# Patient Record
Sex: Female | Born: 1995 | Race: Asian | Hispanic: No | Marital: Single | Smoking: Never smoker
Health system: Southern US, Community
[De-identification: ages and names within clinical notes are randomized; demographics above are authoritative.]

## PROBLEM LIST (undated history)

## (undated) DIAGNOSIS — E079 Disorder of thyroid, unspecified: Secondary | ICD-10-CM

## (undated) DIAGNOSIS — L659 Nonscarring hair loss, unspecified: Secondary | ICD-10-CM

## (undated) HISTORY — DX: Disorder of thyroid, unspecified: E07.9

## (undated) HISTORY — DX: Nonscarring hair loss, unspecified: L65.9

## (undated) HISTORY — PX: NO PAST SURGERIES: SHX2092

---
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
## 2016-02-02 ENCOUNTER — Telehealth (INDEPENDENT_AMBULATORY_CARE_PROVIDER_SITE_OTHER): Payer: Self-pay | Admitting: Pediatric Endocrinology

## 2016-02-02 NOTE — Telephone Encounter (Signed)
Along with the refill, please place lab orders for her January appointment.

## 2016-02-02 NOTE — Telephone Encounter (Signed)
Please send refill for [facility] to [facility] [address].

## 2016-02-03 ENCOUNTER — Other Ambulatory Visit (INDEPENDENT_AMBULATORY_CARE_PROVIDER_SITE_OTHER): Payer: Self-pay | Admitting: *Deleted

## 2016-02-03 DIAGNOSIS — E063 Autoimmune thyroiditis: Secondary | ICD-10-CM

## 2016-02-03 MED ORDER — LEVOTHYROXINE SODIUM 50 MCG PO TABS: 50.0000 ug | ORAL_TABLET | Freq: Every day | ORAL | 4 refills | Status: DC

## 2016-02-03 MED ORDER — LEVOTHYROXINE SODIUM 50 MCG PO TABS: 50.0000 ug | ORAL_TABLET | Freq: Every day | ORAL | 11 refills | Status: DC

## 2016-02-03 NOTE — Telephone Encounter (Signed)
Sent rx to pharmacy and added lab orders as requested.

## 2016-02-26 LAB — T4, FREE: FREE T4: 0.9 ng/dL (ref 0.8–1.4)

## 2016-02-26 LAB — TSH: TSH: 6.63 mIU/L — ABNORMAL HIGH (ref 0.50–4.30)

## 2016-02-29 ENCOUNTER — Ambulatory Visit (INDEPENDENT_AMBULATORY_CARE_PROVIDER_SITE_OTHER): Payer: Medicaid Other | Admitting: Family

## 2016-02-29 ENCOUNTER — Encounter (INDEPENDENT_AMBULATORY_CARE_PROVIDER_SITE_OTHER): Payer: Self-pay | Admitting: Family

## 2016-02-29 VITALS — BP 120/80 | HR 80 | Wt 122.4 lb

## 2016-02-29 DIAGNOSIS — E063 Autoimmune thyroiditis: Secondary | ICD-10-CM

## 2016-02-29 DIAGNOSIS — E038 Other specified hypothyroidism: Secondary | ICD-10-CM

## 2016-02-29 MED ORDER — LEVOTHYROXINE SODIUM 125 MCG PO TABS: 62.5000 ug | ORAL_TABLET | Freq: Every day | ORAL | 2 refills | Status: DC

## 2016-02-29 NOTE — Progress Notes (Signed)
Subjective:  Patient Name: Monique Sharp Date of Birth: [birth year]  MRN: [id number]  Monique Sharp  presents to the office today for follow-up evaluation and management of her hypothyroidism and hashimoto's thyroiditis  HISTORY OF PRESENT ILLNESS:   Monique Sharp is a 21 y.o. [place] female   Kimbria was accompanied by her mother  1. [name] was seen by her pcp in February 2014 for her [facility]. At that visit they discussed her complaints of weight loss, foot cramps, and hair loss. Her PCP obtained lab work which was remarkable for a TSH of 12.7. She did not have follow up labs and was not started on therapy. She was referred to endocrinology for further evaluation and management. Repeat labs drawn at her initial visit in 7/14 were remarkable for a TSH of 10.36 with a TPO ab of 82956 (nml <35) and a TGA of 4284 (nml <45).    2. The patient's last [facility] visit was on 03/17. In the interim, she has been generally healthy.   She is home from [place] on Christmas break. She reports that she is doing well. She is taking of Synthroid per day, she does not miss any doses. She denies fatigue, weight gain, constipation and cold intolerance. Having regular periods.    3. Pertinent Review of Systems:  Constitutional: The patient feels "good". The patient seems healthy and active. Eyes: Vision seems to be good. There are no recognized eye problems. Neck: The patient has no complaints of anterior neck swelling, soreness, tenderness, pressure, discomfort, or difficulty swallowing.   Heart: Heart rate increases with exercise or other physical activity. The patient has no complaints of palpitations, irregular heart beats, chest pain, or chest pressure.   Gastrointestinal: Bowel movents seem normal. The patient has no complaints of excessive hunger, acid reflux, upset stomach, stomach aches or pains, diarrhea, or constipation.  Legs: Muscle mass and strength seem normal. There are no complaints of numbness, tingling, burning,  or pain. No edema is noted.  Feet: There are no obvious foot problems. There are no complaints of numbness, tingling, burning, or pain. No edema is noted. Neurologic: There are no recognized problems with muscle movement and strength, sensation, or coordination.  PAST MEDICAL, FAMILY, AND SOCIAL HISTORY  Past Medical History:  Diagnosis Date  . Hair loss     Family History  Problem Relation Age of Onset  . Thyroid disease Neg Hx      Current Outpatient Prescriptions:  .  levothyroxine (SYNTHROID, LEVOTHROID) 125 MCG tablet, Take 0.5 tablets (62.5 mcg total) by mouth daily., Disp: 15 tablet, Rfl: 2  Allergies as of 02/29/2016  . (No Known Allergies)     reports that she has never smoked. She has never used smokeless tobacco. She reports that she does not drink alcohol or use drugs. Pediatric History  Patient Guardian Status  . Mother:  Sharp,Monique  . Father:  Sharp,Monique   Other Topics Concern  . Not on file   Social History Narrative   Lives with parents and sister  Graduated from [employer]. Biology major at [employer]. Freshman.   Primary Care Provider: Joneen Roach, NP  ROS: There are no other significant problems involving Monique Sharp's other body systems.   Objective:  Vital Signs:  BP 120/80   Pulse 80   Wt 122 lb 6.4 oz (55.5 kg)   BMI 21.71 kg/m  No height on file for this encounter.   Ht Readings from Last 3 Encounters:  05/04/15 5' 2.95" (1.599 m) (31 %,  Z= -0.51)*  02/12/15 5' 2.64" (1.591 m) (27 %, Z= -0.62)*  08/24/14 5' 2.6" (1.59 m) (27 %, Z= -0.63)*   * Growth percentiles are based on CDC 2-20 Years data.   Wt Readings from Last 3 Encounters:  02/29/16 122 lb 6.4 oz (55.5 kg) (41 %, Z= -0.22)*  05/04/15 125 lb 3.2 oz (56.8 kg) (51 %, Z= 0.03)*  02/12/15 122 lb (55.3 kg) (46 %, Z= -0.11)*   * Growth percentiles are based on CDC 2-20 Years data.   HC Readings from Last 3 Encounters:  No data found for [facility]   Body surface area is 1.57  meters squared. No height on file for this encounter. 41 %ile (Z= -0.22) based on CDC 2-20 Years weight-for-age data using vitals from 02/29/2016.    PHYSICAL EXAM:  Constitutional: The patient appears healthy and well nourished. The patient's height and weight are normal for age. She is alert and interactive.  Head: The head is normocephalic. Face: The face appears normal. There are no obvious dysmorphic features. Eyes: The eyes appear to be normally formed and spaced. Gaze is conjugate. There is no obvious arcus or proptosis. Moisture appears normal. Ears: The ears are normally placed and appear externally normal. Mouth: The oropharynx and tongue appear normal. Dentition appears to be normal for age. Oral moisture is normal. Neck: The neck appears to be visibly normal. The thyroid gland is normal in size. The consistency of the thyroid gland is normal. The thyroid gland is not tender to palpation. Lungs: The lungs are clear to auscultation. Air movement is good. Heart: Heart rate and rhythm are regular. Heart sounds S1 and S2 are normal. I did not appreciate any pathologic cardiac murmurs.   LAB DATA:  pending Results for orders placed or performed in visit on 02/03/16 (from the past 504 hour(s))  TSH   Collection Time: 02/25/16  2:52 PM  Result Value Ref Range   TSH 6.63 (H) 0.50 - 4.30 mIU/L  T4, free   Collection Time: 02/25/16  2:52 PM  Result Value Ref Range   Free T4 0.9 0.8 - 1.4 ng/dL     Assessment and Plan:   ASSESSMENT:  1. hashimotos thyroiditis- previously elevated antibodies  2. Hypothyroidism- clinically TSH is elevated on current dose of Synthroid.     PLAN:  1. Diagnostic: TFTs as above. TFT repeat in 6 weeks  2. Therapeutic: Increase Synthroid to 62.5 mcg per day   - Will half a 125 mcg tablet.   - Repeat labs in 6 weeks.  3. Patient education: discussed hypothyroid and treatment using Synthroid. Discussed need to increase based on labs.  No concerns  today. Questions answered and mother endorsed understanding.  4. Follow-up: 3 months     Gretchen ShortSpenser Skylinn Vialpando, FNP-C   Level of Service: This visit lasted in excess of 15 minutes. More than 50% of the visit was devoted to counseling.

## 2016-02-29 NOTE — Patient Instructions (Signed)
-   Get labs drawn in 6 weeks   - Find a [facility] near you  - Start 62. of Synthroid per day   - 1/2 tablet  - Follow up  In 3 month

## 2016-04-09 LAB — T4, FREE: FREE T4: 1.1 ng/dL (ref 0.8–1.4)

## 2016-04-09 LAB — TSH: TSH: 0.89 mIU/L (ref 0.50–4.30)

## 2016-04-11 ENCOUNTER — Telehealth (INDEPENDENT_AMBULATORY_CARE_PROVIDER_SITE_OTHER): Payer: Self-pay | Admitting: Family

## 2016-04-11 MED ORDER — LEVOTHYROXINE SODIUM 125 MCG PO TABS: 62.5000 ug | ORAL_TABLET | Freq: Every day | ORAL | 6 refills | Status: DC

## 2016-04-11 NOTE — Telephone Encounter (Signed)
Discussed that thyroid labs were normal. Continue 62. of synthroid per day. Refills sent.

## 2016-04-12 ENCOUNTER — Encounter (INDEPENDENT_AMBULATORY_CARE_PROVIDER_SITE_OTHER): Payer: Self-pay

## 2016-05-29 ENCOUNTER — Ambulatory Visit (INDEPENDENT_AMBULATORY_CARE_PROVIDER_SITE_OTHER): Payer: Medicaid Other | Admitting: Family

## 2016-06-05 ENCOUNTER — Ambulatory Visit (INDEPENDENT_AMBULATORY_CARE_PROVIDER_SITE_OTHER): Payer: Medicaid Other | Admitting: Family

## 2016-07-04 ENCOUNTER — Telehealth (INDEPENDENT_AMBULATORY_CARE_PROVIDER_SITE_OTHER): Payer: Self-pay | Admitting: Family

## 2016-07-04 NOTE — Telephone Encounter (Signed)
  Who's calling (name and relationship to patient) : [name], mother  Best contact number: [phone]  Provider they see: Gretchen ShortSpenser Beasley, NP  Reason for call: Mother called in stating that [name] is going to [facility] tomorrow to get the labs drawn for Friday's appointment.  She requests the lab orders to be sent to:  [facility] [address], [address] [place], [place] [phone]) [phone]       PRESCRIPTION REFILL ONLY  Name of prescription:  Pharmacy:

## 2016-07-05 ENCOUNTER — Telehealth (INDEPENDENT_AMBULATORY_CARE_PROVIDER_SITE_OTHER): Payer: Self-pay | Admitting: Family

## 2016-07-05 ENCOUNTER — Other Ambulatory Visit (INDEPENDENT_AMBULATORY_CARE_PROVIDER_SITE_OTHER): Payer: Self-pay | Admitting: *Deleted

## 2016-07-05 DIAGNOSIS — E034 Atrophy of thyroid (acquired): Secondary | ICD-10-CM

## 2016-07-05 NOTE — Telephone Encounter (Signed)
Labs placed in portal for [facility].

## 2016-07-05 NOTE — Telephone Encounter (Signed)
Made in error. Emily M Hull °

## 2016-07-06 LAB — T4, FREE: Free T4: 1.28 ng/dL (ref 0.93–1.60)

## 2016-07-06 LAB — T3, FREE: T3, Free: 2.9 pg/mL (ref 2.3–5.0)

## 2016-07-06 LAB — TSH: TSH: 6.17 u[IU]/mL — ABNORMAL HIGH (ref 0.450–4.500)

## 2016-07-07 ENCOUNTER — Ambulatory Visit (INDEPENDENT_AMBULATORY_CARE_PROVIDER_SITE_OTHER): Payer: Self-pay | Admitting: Family

## 2016-07-07 ENCOUNTER — Encounter (INDEPENDENT_AMBULATORY_CARE_PROVIDER_SITE_OTHER): Payer: Self-pay | Admitting: Family

## 2016-07-07 VITALS — BP 108/62 | HR 78 | Ht 62.95 in | Wt 122.4 lb

## 2016-07-07 DIAGNOSIS — E063 Autoimmune thyroiditis: Secondary | ICD-10-CM

## 2016-07-07 NOTE — Progress Notes (Signed)
Subjective:  Patient Name: Monique Sharp Date of Birth: 06/06/1995  MRN: [id number]  Monique Sharp  presents to the office today for follow-up evaluation and management of her hypothyroidism and hashimoto's thyroiditis  HISTORY OF PRESENT ILLNESS:   Monique Sharp is a 21 y.o. [place] female   Monique Sharp was accompanied by her mother  1. Monique Sharp was [facility] by her [facility] in [facility]. At that visit they discussed her complaints of weight loss, foot cramps, and hair loss. Her PCP obtained lab work which was remarkable for a TSH of 12.7. She did not have follow up labs and was not started on therapy. She was referred to endocrinology for further evaluation and management. Repeat labs drawn at her initial visit in 7/14 were remarkable for a TSH of 10.36 with a TPO ab of [id number] (nml <35) and a TGA of 4284 (nml <45).    2. The patient's last [facility] visit was on 03/17. In the interim, she has been generally healthy.   [place] is home [place] on summer break. She is planning to stay in [place] all summer and hopes to get a summer job.  She reports that she is doing well. She is taking 62.5mcg of Synthroid per day, she rarely misses any doses. She denies fatigue, weight gain, constipation and cold intolerance. Having regular periods.    3. Pertinent Review of Systems:  Constitutional: The patient feels "good". The patient seems healthy and active. Eyes: Vision seems to be good. There are no recognized eye problems. Neck: The patient has no complaints of anterior neck swelling, soreness, tenderness, pressure, discomfort, or difficulty swallowing.   Heart: Heart rate increases with exercise or other physical activity. The patient has no complaints of palpitations, irregular heart beats, chest pain, or chest pressure.   Gastrointestinal: Bowel movents seem normal. The patient has no complaints of excessive hunger, acid reflux, upset stomach, stomach aches or pains, diarrhea, or constipation.  Legs: Muscle mass  and strength seem normal. There are no complaints of numbness, tingling, burning, or pain. No edema is noted.  Feet: There are no obvious foot problems. There are no complaints of numbness, tingling, burning, or pain. No edema is noted. Neurologic: There are no recognized problems with muscle movement and strength, sensation, or coordination.  PAST MEDICAL, FAMILY, AND SOCIAL HISTORY  Past Medical History:  Diagnosis Date  . Hair loss     Family History  Problem Relation Age of Onset  . Thyroid disease Neg Hx      Current Outpatient Prescriptions:  .  levothyroxine (SYNTHROID, LEVOTHROID) 125 MCG tablet, Take 0.5 tablets (62.5 mcg total) by mouth daily., Disp: 15 tablet, Rfl: 6  Allergies as of 07/07/2016  . (No Known Allergies)     reports that she has never smoked. She has never used smokeless tobacco. She reports that she does not drink alcohol or use drugs. Pediatric History  Patient Guardian Status  . Mother:  Monique Sharp,Monique Sharp  . Father:  Monique Sharp,Monique Sharp   Other Topics Concern  . Not on file   Social History Narrative   Lives with parents and sister  Graduated from [employer]. Biology major at [employer]. Freshman.   Primary Care Provider: Joneen Roachhomas, Gillian W, NP  ROS: There are no other significant problems involving Suellen's other body systems.   Objective:  Vital Signs:  BP 108/62   Pulse 78   Ht 5' 2.95" (1.599 m)   Wt 122 lb 6.4 oz (55.5 kg)   BMI 21.71 kg/m  Blood pressure percentiles are 31.7 % systolic and 30.8 % diastolic based on the August 2017 AAP Clinical Practice Guideline.   Ht Readings from Last 3 Encounters:  07/07/16 5' 2.95" (1.599 m) (30 %, Z= -0.52)*  05/04/15 5' 2.95" (1.599 m) (31 %, Z= -0.51)*  02/12/15 5' 2.64" (1.591 m) (27 %, Z= -0.62)*   * Growth percentiles are based on CDC 2-20 Years data.   Wt Readings from Last 3 Encounters:  07/07/16 122 lb 6.4 oz (55.5 kg) (40 %, Z= -0.26)*  02/29/16 122 lb 6.4 oz (55.5 kg) (41 %, Z= -0.22)*   05/04/15 125 lb 3.2 oz (56.8 kg) (51 %, Z= 0.03)*   * Growth percentiles are based on CDC 2-20 Years data.   HC Readings from Last 3 Encounters:  No data found for [facility]   Body surface area is 1.57 meters squared. 30 %ile (Z= -0.52) based on CDC 2-20 Years stature-for-age data using vitals from 07/07/2016. 40 %ile (Z= -0.26) based on CDC 2-20 Years weight-for-age data using vitals from 07/07/2016.    PHYSICAL EXAM:  Constitutional: The patient appears healthy and well nourished. The patient's height and weight are normal for age. She is alert and interactive.  Head: The head is normocephalic. Face: The face appears normal. There are no obvious dysmorphic features. Eyes: The eyes appear to be normally formed and spaced. Gaze is conjugate. There is no obvious arcus or proptosis. Moisture appears normal. Ears: The ears are normally placed and appear externally normal. Mouth: The oropharynx and tongue appear normal. Dentition appears to be normal for age. Oral moisture is normal. Neck: The neck appears to be visibly normal. The thyroid gland is normal in size. The consistency of the thyroid gland is normal. The thyroid gland is not tender to palpation. Lungs: The lungs are clear to auscultation. Air movement is good. Heart: Heart rate and rhythm are regular. Heart sounds S1 and S2 are normal. I did not appreciate any pathologic cardiac murmurs.   LAB DATA:  pending Results for orders placed or performed in visit on 07/05/16 (from the past 504 hour(s))  TSH   Collection Time: 07/05/16  3:09 PM  Result Value Ref Range   TSH 6.170 (H) 0.450 - 4.500 uIU/mL  T4, free   Collection Time: 07/05/16  3:09 PM  Result Value Ref Range   Free T4 1.28 0.93 - 1.60 ng/dL  T3, free   Collection Time: 07/05/16  3:09 PM  Result Value Ref Range   T3, Free 2.9 2.3 - 5.0 pg/mL     Assessment and Plan:   ASSESSMENT:  1. hashimotos thyroiditis- previously elevated antibodies  2. Hypothyroidism-  clinically TSH is elevated on current dose of Synthroid. Her pattern has been she will have slight elevation one visit and the next will be low normal. She is clinically euthyroid.     PLAN:  1. Diagnostic: TFTs as above. Repeat in 3 months.  2. Therapeutic:Continue 62.5 mcg per day    - Repeat labs in 3 months. If TSH is elevated or she becomes symptomatic we will increase synthroid to per day.  3. Patient education: discussed hypothyroid and treatment using Synthroid. Discussed need to increase based on labs.  No concerns today. Questions answered and mother endorsed understanding.  4. Follow-up: 3 months     Gretchen Short, FNP-C   Level of Service: This visit lasted in excess of 15 minutes. More than 50% of the visit was devoted to counseling.

## 2016-07-07 NOTE — Patient Instructions (Signed)
Continue 62.5 mcg per day, ever day  Take first thing in the morning  Follow up in 3 months, labs prior to visit.

## 2016-10-09 ENCOUNTER — Ambulatory Visit (INDEPENDENT_AMBULATORY_CARE_PROVIDER_SITE_OTHER): Payer: Medicaid Other | Admitting: Family

## 2016-12-14 ENCOUNTER — Other Ambulatory Visit (INDEPENDENT_AMBULATORY_CARE_PROVIDER_SITE_OTHER): Payer: Self-pay | Admitting: Family

## 2016-12-15 ENCOUNTER — Telehealth (INDEPENDENT_AMBULATORY_CARE_PROVIDER_SITE_OTHER): Payer: Self-pay

## 2016-12-15 NOTE — Telephone Encounter (Signed)
Call to mom [name] advised refilled medication for 30 days only until she schedules follow up appt. Adv sent note to Gretchen ShortSpenser Beasley FNP about the refill. She did not keep her appt in Aug. And has not scheduled follow up. Mom reports she was studying abroad and when she got home had to go back to [place] for classes and will not be home until Christmas break. Advised will let provider know. She is going to see her this weekend and take her med to her and will call and make visit after finding out her schedule.

## 2017-01-20 ENCOUNTER — Other Ambulatory Visit (INDEPENDENT_AMBULATORY_CARE_PROVIDER_SITE_OTHER): Payer: Self-pay | Admitting: Family

## 2017-01-23 ENCOUNTER — Telehealth (INDEPENDENT_AMBULATORY_CARE_PROVIDER_SITE_OTHER): Payer: Self-pay | Admitting: Family

## 2017-01-23 NOTE — Telephone Encounter (Signed)
°  Who's calling (name and relationship to patient) : Mom Best contact number: [phone] Provider they see: Ovidio KinSpenser  Reason for call: Mom stated that pt needs a refill and the pharmacy needs to be changed. The pharmacy I listed below is the new pharmacy mom would like to use.     PRESCRIPTION REFILL ONLY  Name of prescription: Levothyroxine Pharmacy: [id number]  [address] [phone]

## 2017-01-23 NOTE — Telephone Encounter (Signed)
[phone]- confirmed patient has not picked up an rx for this month. Also confirmed all patient needs to do is call [facility] [place] and ask them to prepare the rx for her they are able to see the order per the pharmacist.  Call to Mom left message for Teea to call the pharm there and they will prepare rx. Reminded about Dec appt and if that is not kept she will not be able to receive refills.

## 2017-02-12 ENCOUNTER — Other Ambulatory Visit (INDEPENDENT_AMBULATORY_CARE_PROVIDER_SITE_OTHER): Payer: Self-pay | Admitting: Family

## 2017-02-12 ENCOUNTER — Other Ambulatory Visit (INDEPENDENT_AMBULATORY_CARE_PROVIDER_SITE_OTHER): Payer: Self-pay | Admitting: *Deleted

## 2017-02-12 ENCOUNTER — Telehealth (INDEPENDENT_AMBULATORY_CARE_PROVIDER_SITE_OTHER): Payer: Self-pay | Admitting: Family

## 2017-02-12 DIAGNOSIS — E063 Autoimmune thyroiditis: Secondary | ICD-10-CM

## 2017-02-12 NOTE — Telephone Encounter (Signed)
°  Who's calling (name and relationship to patient) : Jacklyn ShellSmita, mother Best contact number: [phone] Provider they see: Gretchen ShortSpenser Beasley Reason for call: Please place [facility] orders [facility] instead of [facility]. Patient is on her way there now.      PRESCRIPTION REFILL ONLY  Name of prescription:  Pharmacy:

## 2017-02-13 ENCOUNTER — Ambulatory Visit (INDEPENDENT_AMBULATORY_CARE_PROVIDER_SITE_OTHER): Payer: Self-pay | Admitting: Family

## 2017-02-13 ENCOUNTER — Encounter (INDEPENDENT_AMBULATORY_CARE_PROVIDER_SITE_OTHER): Payer: Self-pay | Admitting: Family

## 2017-02-13 VITALS — BP 100/68 | HR 88 | Ht 62.8 in | Wt 120.4 lb

## 2017-02-13 DIAGNOSIS — E063 Autoimmune thyroiditis: Secondary | ICD-10-CM

## 2017-02-13 MED ORDER — LEVOTHYROXINE SODIUM 88 MCG PO TABS: 88.0000 ug | ORAL_TABLET | Freq: Every day | ORAL | 3 refills | Status: DC

## 2017-02-13 MED ORDER — LEVOTHYROXINE SODIUM 75 MCG PO TABS
75.0000 ug | ORAL_TABLET | Freq: Every day | ORAL | 3 refills | Status: DC
Start: 2017-02-13 — End: 2017-02-26

## 2017-02-13 NOTE — Patient Instructions (Addendum)
Increase Synthroid to 75 mcg per day  Labs and follow up in 4 months.   If you feels like your heart is beating to fast, cant sleep, irritable, sweating, anxious. Please call office and let me know.

## 2017-02-13 NOTE — Telephone Encounter (Signed)
Call to mom [name] adv per Spenser to change the synthroid to 75 mcg he sent new Rx to pharmacy. Mom requests copy of the labs be faxed mailed to her at [email or web address]   Verified pharm and tried to call x 3 to confirm receipt of rx but could not get pharm to pick up on call.

## 2017-02-13 NOTE — Progress Notes (Signed)
Subjective:  Patient Name: Monique Sharp Date of Birth: 12/19/1995  MRN: [id number]  Monique Sharp  presents to the office today for follow-up evaluation and management of her hypothyroidism and hashimoto's thyroiditis  HISTORY OF PRESENT ILLNESS:   Monique Sharp is a 21 y.o. [place] female   Monique Sharp was accompanied by her mother  1. Monique Sharp was seen by her pcp in February [facility]. At that visit they discussed her complaints of weight loss, foot cramps, and hair loss. Her PCP obtained lab work which was remarkable for a TSH of 12.7. She did not have follow up labs and was not started on therapy. She was referred to endocrinology for further evaluation and management. Repeat labs drawn at her initial visit in 7/14 were remarkable for a TSH of 10.36 with a TPO ab of [id number] (nml <35) and a TGA of 4284 (nml <45).    2. The patient's last PSSG visit was on 06/2016. In the interim, she has been generally healthy.   Monique Sharp is doing good, she is excited to be home for break. She reports that she has done well in school this year and feels healthy. She is taking 75 mcg of Levothyroxine per day and denies any missed doses. She denies fatigue, constipation and cold intolerance. She had labs drawn at [facility] yesterday. Mother would like to know if Monique Sharp will ever be able to come off Levothyroxine or if there are any supplements she can take instead of the medication.    3. Pertinent Review of Systems:  Constitutional: The patient feels " fine". The patient seems healthy and active. Eyes: Vision seems to be good. There are no recognized eye problems. Neck: The patient has no complaints of anterior neck swelling, soreness, tenderness, pressure, discomfort, or difficulty swallowing.   Heart: Heart rate increases with exercise or other physical activity. The patient has no complaints of palpitations, irregular heart beats, chest pain, or chest pressure.   Gastrointestinal: Bowel movents seem normal. The patient  has no complaints of excessive hunger, acid reflux, upset stomach, stomach aches or pains, diarrhea, or constipation.  Legs: Muscle mass and strength seem normal. There are no complaints of numbness, tingling, burning, or pain. No edema is noted.  Feet: There are no obvious foot problems. There are no complaints of numbness, tingling, burning, or pain. No edema is noted. Neurologic: There are no recognized problems with muscle movement and strength, sensation, or coordination.  PAST MEDICAL, FAMILY, AND SOCIAL HISTORY  Past Medical History:  Diagnosis Date  . Hair loss     Family History  Problem Relation Age of Onset  . Thyroid disease Neg Hx      Current Outpatient Medications:  .  levothyroxine (SYNTHROID) 88 MCG tablet, Take 1 tablet (88 mcg total) by mouth daily before breakfast., Disp: 30 tablet, Rfl: 3  Allergies as of 02/13/2017  . (No Known Allergies)     reports that  has never smoked. she has never used smokeless tobacco. She reports that she does not drink alcohol or use drugs. Pediatric History  Patient Guardian Status  . Mother:  Claiborne,[name]  . Father:  Ruffalo,[name]   Other Topics Concern  . Not on file  Social History Narrative   Lives with parents and sister   Sophomore at [employer]  Primary Care Provider: Joneen Roachhomas, Gillian W, NP  ROS: There are no other significant problems involving Bethel's other body systems.   Objective:  Vital Signs:  BP 100/68   Pulse 88  Ht 5' 2.8" (1.595 m)   Wt 120 lb 6.4 oz (54.6 kg)   BMI 21.47 kg/m  Growth percentile SmartLinks can only be used for patients less than 20 years old.   Ht Readings from Last 3 Encounters:  02/13/17 5' 2.8" (1.595 m)  07/07/16 5' 2.95" (1.599 m) (30 %, Z= -0.52)*  05/04/15 5' 2.95" (1.599 m) (31 %, Z= -0.51)*   * Growth percentiles are based on CDC (Girls, 2-20 Years) data.   Wt Readings from Last 3 Encounters:  02/13/17 120 lb 6.4 oz (54.6 kg)  07/07/16 122 lb 6.4 oz (55.5 kg) (40 %, Z=  -0.26)*  02/29/16 122 lb 6.4 oz (55.5 kg) (41 %, Z= -0.22)*   * Growth percentiles are based on CDC (Girls, 2-20 Years) data.   HC Readings from Last 3 Encounters:  No data found for [facility]   Body surface area is 1.56 meters squared. Facility age limit for growth percentiles is 20 years. Facility age limit for growth percentiles is 20 years.    PHYSICAL EXAM: General: Well developed, well nourished female in no acute distress.  Appears stated age. She is alert and oriented.  Head: Normocephalic, atraumatic.   Eyes:  Pupils equal and round. EOMI.   Sclera white.  No eye drainage.   Ears/Nose/Mouth/Throat: Nares patent, no nasal drainage.  Normal dentition, mucous membranes moist.  Oropharynx intact. Neck: supple, no cervical lymphadenopathy, no thyromegaly Cardiovascular: regular rate, normal S1/S2, no murmurs Respiratory: No increased work of breathing.  Lungs clear to auscultation bilaterally.  No wheezes. Abdomen: soft, nontender, nondistended. Normal bowel sounds.  No appreciable masses  Extremities: warm, well perfused, cap refill < 2 sec.   Musculoskeletal: Normal muscle mass.  Normal strength Skin: warm, dry.  No rash or lesions. Neurologic: alert and oriented, normal speech and gait   LAB DATA:   Labs done on 02/12/2017  TSH: 7.61 FT4: 1.23   Assessment and Plan:   ASSESSMENT:  1. hashimotos thyroiditis- previously elevated antibodies  2. Hypothyroidism- Although she is clinically euthyroid on 62.5 mcg of Synthroid per day, her labs show elevated TSH.    PLAN:  1. Diagnostic: TFTs as above. Repeat in 4 months.  2. Therapeutic:Increase Levothyroxine to 75 mcg per day    3. Patient education: Discussed all of the above in detail. Answered families questions.  4. Follow-up: 4 months   LOS: this visit lasted >15 minutes. More then 50% of the visit was devoted to counseling.    Gretchen ShortSpenser Shyloh Derosa, FNP-C

## 2017-02-15 ENCOUNTER — Other Ambulatory Visit (INDEPENDENT_AMBULATORY_CARE_PROVIDER_SITE_OTHER): Payer: Self-pay | Admitting: *Deleted

## 2017-02-26 ENCOUNTER — Other Ambulatory Visit (INDEPENDENT_AMBULATORY_CARE_PROVIDER_SITE_OTHER): Payer: Self-pay | Admitting: *Deleted

## 2017-02-26 MED ORDER — LEVOTHYROXINE SODIUM 75 MCG PO TABS: 75.0000 ug | ORAL_TABLET | Freq: Every day | ORAL | 2 refills | Status: DC

## 2017-02-28 LAB — TSH: TSH: 7.61 u[IU]/mL — ABNORMAL HIGH (ref 0.450–4.500)

## 2017-02-28 LAB — T4, FREE: Free T4: 1.23 ng/dL (ref 0.82–1.77)

## 2017-04-17 ENCOUNTER — Other Ambulatory Visit (INDEPENDENT_AMBULATORY_CARE_PROVIDER_SITE_OTHER): Payer: Self-pay | Admitting: *Deleted

## 2017-04-17 ENCOUNTER — Telehealth (INDEPENDENT_AMBULATORY_CARE_PROVIDER_SITE_OTHER): Payer: Self-pay | Admitting: Family

## 2017-04-17 MED ORDER — LEVOTHYROXINE SODIUM 75 MCG PO TABS: 75.0000 ug | ORAL_TABLET | Freq: Every day | ORAL | 1 refills | Status: DC

## 2017-04-17 NOTE — Telephone Encounter (Signed)
°  Who's calling (name and relationship to patient) : [name] (Mother) Best contact number: [phone] Provider they see: Ovidio KinSpenser Reason for call: Mom requesting a refill on Levothyroxine. Please switch pharmacy to the pharmacy below.   [facility]  [address]  [place], [place] [id number]

## 2017-04-17 NOTE — Telephone Encounter (Signed)
LVM advised pharmacy changed and script sent.

## 2017-05-16 ENCOUNTER — Other Ambulatory Visit (INDEPENDENT_AMBULATORY_CARE_PROVIDER_SITE_OTHER): Payer: Self-pay | Admitting: Family

## 2017-05-16 DIAGNOSIS — E063 Autoimmune thyroiditis: Secondary | ICD-10-CM

## 2017-05-16 NOTE — Telephone Encounter (Signed)
°  Who's calling (name and relationship to patient) : [name] (Mother) Best contact number: [phone] Provider they see: Ovidio KinSpenser Reason for call: Mom would like a refill for pt on Synthroid. [facility] to [facility] [address] [place]. Please call mom to confirm when this has been sent to the pharmacy.

## 2017-05-17 MED ORDER — LEVOTHYROXINE SODIUM 75 MCG PO TABS: 75.0000 ug | ORAL_TABLET | Freq: Every day | ORAL | 0 refills | Status: DC

## 2017-05-17 NOTE — Telephone Encounter (Signed)
Call back to mom [name]- advised her follow up appt is due in April- RN can give her a 1 month supply or she can have labs drawn per Gretchen ShortSpenser Beasley FNP or schedule f/u.  Mom reports she will be home in April for Easter break but will have to call back with her schedule before she schedules the follow up appt. Advised will give 1 month supply for now and can refill when she comes for her appt. Mom agrees with plan

## 2017-05-23 ENCOUNTER — Telehealth (INDEPENDENT_AMBULATORY_CARE_PROVIDER_SITE_OTHER): Payer: Self-pay | Admitting: Family

## 2017-05-23 DIAGNOSIS — E063 Autoimmune thyroiditis: Secondary | ICD-10-CM

## 2017-05-23 NOTE — Telephone Encounter (Signed)
°  Who's calling (name and relationship to patient) : [name] (Mother) Best contact number: [phone] Provider they see: Ovidio KinSpenser Reason for call: Mom would like to have lab orders put in for pt. Pt will be coming into town Friday. Mom stated that orders need to be sent [facility] [place] and [facility] [address] (in case pt will have labs drawn Saturday). Mom would like a call back confirming that this has been done.

## 2017-05-24 NOTE — Telephone Encounter (Addendum)
Labs sent to [facility] and [facility] Spoke to mom and let her know.

## 2017-05-24 NOTE — Addendum Note (Signed)
Addended by: Vallery SaSLEMONS, Lazara Grieser E on: 05/24/2017 08:30 AM   Modules accepted: Orders

## 2017-06-14 ENCOUNTER — Other Ambulatory Visit (INDEPENDENT_AMBULATORY_CARE_PROVIDER_SITE_OTHER): Payer: Self-pay | Admitting: *Deleted

## 2017-06-14 ENCOUNTER — Telehealth (INDEPENDENT_AMBULATORY_CARE_PROVIDER_SITE_OTHER): Payer: Self-pay | Admitting: Family

## 2017-06-14 DIAGNOSIS — E063 Autoimmune thyroiditis: Secondary | ICD-10-CM

## 2017-06-14 NOTE — Telephone Encounter (Signed)
Who's calling (name and relationship to patient) : Lindo,[name] (Mother) Best contact number: [phone] ([phone] Provider they see: Ovidio KinSpenser, NP Reason for call: Mother of patient called and stated patient was home for break and she wanted to get her labs drawn. However, [facility] is closer to home for patient. Mother is requesting the labs be sent in at [facility] on [address] in [place], [place]. Mother requested a call back once labs have been entered.

## 2017-06-14 NOTE — Telephone Encounter (Signed)
Spoke to mother, advised labs placed in portal for [facility]. Asked her to have Jillene call and make a follow up when she gets home in May.

## 2017-06-15 LAB — TSH: TSH: 1.25 u[IU]/mL (ref 0.450–4.500)

## 2017-06-15 LAB — T4, FREE: FREE T4: 1.53 ng/dL (ref 0.82–1.77)

## 2017-06-18 ENCOUNTER — Encounter (INDEPENDENT_AMBULATORY_CARE_PROVIDER_SITE_OTHER): Payer: Self-pay | Admitting: *Deleted

## 2017-06-18 ENCOUNTER — Telehealth (INDEPENDENT_AMBULATORY_CARE_PROVIDER_SITE_OTHER): Payer: Self-pay | Admitting: Family

## 2017-06-18 DIAGNOSIS — E063 Autoimmune thyroiditis: Secondary | ICD-10-CM

## 2017-06-18 MED ORDER — LEVOTHYROXINE SODIUM 75 MCG PO TABS: 75.0000 ug | ORAL_TABLET | Freq: Every day | ORAL | 0 refills | Status: DC

## 2017-06-18 NOTE — Telephone Encounter (Signed)
°  Who's calling (name and relationship to patient) : [name] (Mother) Best contact number: [phone] Provider they see: Ovidio KinSpenser Reason for call: Mom would like refill on Synthroid for pt.      PRESCRIPTION REFILL ONLY  Name of prescription: Synthroid  Ph[facility]: [id number] [address]  [place], [place]

## 2017-07-19 ENCOUNTER — Telehealth (INDEPENDENT_AMBULATORY_CARE_PROVIDER_SITE_OTHER): Payer: Self-pay | Admitting: Family

## 2017-07-19 NOTE — Telephone Encounter (Signed)
Who's calling (name and relationship to patient) : Monique Sharp (patient) Best contact number: [phone] Provider they see: Ovidio Kin  Reason for call: Patient need medication sent to [facility] she is home from school in [place].      PRESCRIPTION REFILL ONLY  Name of prescription: Levothyoxine   [facility] [address]

## 2017-07-19 NOTE — Telephone Encounter (Signed)
Left voicemail for patient to call back. When patient calls back we need to schedule a follow up appointment before we can refill her levothyroxine as she has not been in our office since 01/2017.

## 2017-07-20 ENCOUNTER — Ambulatory Visit (INDEPENDENT_AMBULATORY_CARE_PROVIDER_SITE_OTHER): Payer: BLUE CROSS/BLUE SHIELD | Admitting: Family

## 2017-07-20 ENCOUNTER — Encounter (INDEPENDENT_AMBULATORY_CARE_PROVIDER_SITE_OTHER): Payer: Self-pay | Admitting: Family

## 2017-07-20 VITALS — BP 116/68 | HR 80 | Ht 63.19 in | Wt 115.2 lb

## 2017-07-20 DIAGNOSIS — E063 Autoimmune thyroiditis: Secondary | ICD-10-CM | POA: Diagnosis not present

## 2017-07-20 LAB — TSH: TSH: 1.25 m[IU]/L

## 2017-07-20 LAB — T4: T4, Total: 9.6 ug/dL (ref 5.3–11.7)

## 2017-07-20 LAB — T4, FREE: FREE T4: 1.4 ng/dL (ref 0.8–1.4)

## 2017-07-20 NOTE — Patient Instructions (Signed)
Continue synthroid dose  Labs today

## 2017-07-20 NOTE — Telephone Encounter (Signed)
Patient seen in office 05/24

## 2017-07-22 ENCOUNTER — Encounter (INDEPENDENT_AMBULATORY_CARE_PROVIDER_SITE_OTHER): Payer: Self-pay | Admitting: Family

## 2017-07-22 MED ORDER — LEVOTHYROXINE SODIUM 75 MCG PO TABS: 75.0000 ug | ORAL_TABLET | Freq: Every day | ORAL | 6 refills | Status: DC

## 2017-07-22 NOTE — Progress Notes (Signed)
Subjective:  Patient Name: Monique Sharp Date of Birth: 1996/01/22  MRN: [id number]  Monique Sharp  presents to the office today for follow-up evaluation and management of her hypothyroidism and hashimoto's thyroiditis  HISTORY OF PRESENT ILLNESS:   Monique Sharp is a 22 y.o. [place] female   Monique Sharp was accompanied by her mother  1. Hamna was seen by her pcp in February 2014 for her [facility]. At that visit they discussed her complaints of weight loss, foot cramps, and hair loss. Her PCP obtained lab work which was remarkable for a TSH of 12.7. She did not have follow up labs and was not started on therapy. She was referred to endocrinology for further evaluation and management. Repeat labs drawn at her initial visit in 7/14 were remarkable for a TSH of 10.36 with a TPO ab of 13086 (nml <35) and a TGA of 4284 (nml <45).    2. The patient's last PSSG visit was on 06/2016. In the interim, she has been generally healthy.   She is home on summer break, will be taking 2 classes over the summer. 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 She is taking 75 mcg of levothyroxine per day, she is not missing any doses. She denies fatigue, constipation and cold intolerance.    3. Pertinent Review of Systems:  Constitutional: The patient feels " good". The patient seems healthy and active. Eyes: Vision seems to be good. There are no recognized eye problems. Neck: The patient has no complaints of anterior neck swelling, soreness, tenderness, pressure, discomfort, or difficulty swallowing.   Heart: Heart rate increases with exercise or other physical activity. The patient has no complaints of palpitations, irregular heart beats, chest pain, or chest pressure.   Gastrointestinal: Bowel movents seem normal. The patient has no complaints of excessive hunger, acid reflux, upset stomach, stomach aches or pains, diarrhea, or constipation.  Legs: Muscle mass and strength seem normal.  There are no complaints of numbness, tingling, burning, or pain. No edema is noted.  Feet: There are no obvious foot problems. There are no complaints of numbness, tingling, burning, or pain. No edema is noted. Neurologic: There are no recognized problems with muscle movement and strength, sensation, or coordination.  PAST MEDICAL, FAMILY, AND SOCIAL HISTORY  Past Medical History:  Diagnosis Date  . Hair loss     Family History  Problem Relation Age of Onset  . Thyroid disease Neg Hx      Current Outpatient Medications:  .  levothyroxine (SYNTHROID, LEVOTHROID) 75 MCG tablet, Take 1 tablet (75 mcg total) by mouth daily., Disp: 30 tablet, Rfl: 0  Allergies as of 07/20/2017  . (No Known Allergies)     reports that she has never smoked. She has never used smokeless tobacco. She reports that she does not drink alcohol or use drugs. Pediatric History  Patient Guardian Status  . Mother:  Opiela,[name]  . Father:  Boyack,[name]   Other Topics Concern  . Not on file  Social History Narrative   Lives with parents and sister   Sophomore at [employer]  Primary Care Provider: Joneen Roach, NP  ROS: There are no other significant problems involving Samanth's other body systems.   Objective:  Vital Signs:  BP 116/68   Pulse 80   Ht 5' 3.19" (1.605 m)   Wt 115 lb 3.2 oz (52.3 kg)   LMP 07/01/2017 (Within Days)   BMI 20.28 kg/m  Growth percentile SmartLinks can only be  used for patients less than 60 years old.   Ht Readings from Last 3 Encounters:  07/20/17 5' 3.19" (1.605 m)  02/13/17 5' 2.8" (1.595 m)  07/07/16 5' 2.95" (1.599 m) (30 %, Z= -0.52)*   * Growth percentiles are based on CDC (Girls, 2-20 Years) data.   Wt Readings from Last 3 Encounters:  07/20/17 115 lb 3.2 oz (52.3 kg)  02/13/17 120 lb 6.4 oz (54.6 kg)  07/07/16 122 lb 6.4 oz (55.5 kg) (40 %, Z= -0.26)*   * Growth percentiles are based on CDC (Girls, 2-20 Years) data.   HC Readings from Last 3 Encounters:   No data found for [facility]   Body surface area is 1.53 meters squared. Facility age limit for growth percentiles is 20 years. Facility age limit for growth percentiles is 20 years.    PHYSICAL EXAM:  General: Well developed, well nourished female in no acute distress.  She is alert and oriented.  Head: Normocephalic, atraumatic.   Eyes:  Pupils equal and round. EOMI.   Sclera white.  No eye drainage.   Ears/Nose/Mouth/Throat: Nares patent, no nasal drainage.  Normal dentition, mucous membranes moist.  Neck: supple, no cervical lymphadenopathy, no thyromegaly Cardiovascular: regular rate, normal S1/S2, no murmurs Respiratory: No increased work of breathing.  Lungs clear to auscultation bilaterally.  No wheezes. Abdomen: soft, nontender, nondistended. Normal bowel sounds.  No appreciable masses  Extremities: warm, well perfused, cap refill < 2 sec.   Musculoskeletal: Normal muscle mass.  Normal strength Skin: warm, dry.  No rash or lesions. Neurologic: alert and oriented, normal speech, no tremor   LAB DATA: Due today    Assessment and Plan:   ASSESSMENT:  1. Hypothyroid: clinically euthyroid on 75 mcg of Levothyroxine per day. She is due for TFTs today.    PLAN:  1. Diagnostic: TFTs  2. Therapeutic: 75 mcg of levothyroxine per day   3. Patient education: Reviewed growth chart. Discussed signs and symptoms of hypothyroid. Answered questions.  4. Follow-up: 6 months   LOS: this visit >15 minutes. More then 50% of the visit was devoted to counseling.    Gretchen Short, FNP-C

## 2017-07-24 ENCOUNTER — Telehealth (INDEPENDENT_AMBULATORY_CARE_PROVIDER_SITE_OTHER): Payer: Self-pay

## 2017-07-24 NOTE — Telephone Encounter (Signed)
-----   Message from Gretchen Short, NP sent at 07/24/2017  7:53 AM EDT ----- Please notify patient TFTs are normal. 6 month prescription sent.

## 2017-07-24 NOTE — Telephone Encounter (Signed)
Spoke with mom (okay per DPR) and let her know per Spenser "TFTs are normal, 6 month prescription sent" Mom states understanding and gratitude before ending the call.

## 2018-01-21 ENCOUNTER — Ambulatory Visit (INDEPENDENT_AMBULATORY_CARE_PROVIDER_SITE_OTHER): Payer: BLUE CROSS/BLUE SHIELD | Admitting: Family

## 2018-02-09 LAB — T4, FREE: Free T4: 1.45 ng/dL (ref 0.82–1.77)

## 2018-02-09 LAB — TSH: TSH: 4.12 u[IU]/mL (ref 0.450–4.500)

## 2018-02-12 ENCOUNTER — Ambulatory Visit (INDEPENDENT_AMBULATORY_CARE_PROVIDER_SITE_OTHER): Payer: BLUE CROSS/BLUE SHIELD | Admitting: Family

## 2018-02-12 ENCOUNTER — Encounter (INDEPENDENT_AMBULATORY_CARE_PROVIDER_SITE_OTHER): Payer: Self-pay | Admitting: Family

## 2018-02-12 VITALS — BP 116/70 | HR 88 | Ht 63.15 in | Wt 122.6 lb

## 2018-02-12 DIAGNOSIS — E063 Autoimmune thyroiditis: Secondary | ICD-10-CM

## 2018-02-12 MED ORDER — LEVOTHYROXINE SODIUM 75 MCG PO TABS: 75.0000 ug | ORAL_TABLET | Freq: Every day | ORAL | 6 refills | Status: DC

## 2018-02-12 NOTE — Progress Notes (Signed)
Subjective:  Patient Name: Monique Sharp Date of Birth: [birth year]  MRN: [id number]  Monique Sharp  Sharp to the office today for follow-up evaluation and management of her hypothyroidism and hashimoto's thyroiditis  HISTORY OF PRESENT ILLNESS:   Monique Sharp is a 22 y.o. [place] female   Monique Sharp was accompanied by her mother  1. Monique Sharp was seen by her pcp in February 2014 for her [facility]. At that visit they discussed her complaints of weight loss, foot cramps, and hair loss. Her PCP obtained lab work which was remarkable for a TSH of 12.7. She did not have follow up labs and was not started on therapy. She was referred to endocrinology for further evaluation and management. Repeat labs drawn at her initial visit in 7/14 were remarkable for a TSH of 10.36 with a TPO ab of 78295 (nml <35) and a TGA of 4284 (nml <45).    2. The patient's last PSSG visit was on 06/2017. In the interim, she has been generally healthy.   Currently home on Holiday break. 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 She hopes to go to medical school. She has been eating well and exercising a few times per week. She takes 75 mcg of levothyroxine per day and rarely misses a dose. She denies constipation, fatigue and cold intolerance.    3. Pertinent Review of Systems:  All systems reviewed with pertinent positives listed below; otherwise negative. Constitutional: Sleeping well. Good energy and appetite. 7 pound weight gain.  HEENT: No difficulty swallowing. No neck pain.  Respiratory: No increased work of breathing currently Cardiac: no palpitations, no tachycardia.  GI: No constipation or diarrhea Musculoskeletal: No joint deformity Neuro: Normal affect Endocrine: As above   PAST MEDICAL, FAMILY, AND SOCIAL HISTORY  Past Medical History:  Diagnosis Date  . Hair loss     Family History  Problem Relation Age of Onset  . Thyroid disease Neg Hx      Current  Outpatient Medications:  .  levothyroxine (SYNTHROID, LEVOTHROID) 75 MCG tablet, Take 1 tablet (75 mcg total) by mouth daily., Disp: 30 tablet, Rfl: 6  Allergies as of 02/12/2018  . (No Known Allergies)     reports that she has never smoked. She has never used smokeless tobacco. She reports that she does not drink alcohol or use drugs. Pediatric History  Patient Parents  . Glomski,[name] (Mother)  . Maser,[name] (Father)   Other Topics Concern  . Not on file  Social History Narrative   Lives with parents and sister   Senior at [employer]  Primary Care Provider: Gillian Scarce, MD  ROS: There are no other significant problems involving Monique Sharp's other body systems.   Objective:  Vital Signs:  BP 116/70   Pulse 88   Ht 5' 3.15" (1.604 m)   Wt 122 lb 9.6 oz (55.6 kg)   BMI 21.61 kg/m  Growth percentile SmartLinks can only be used for patients less than 21 years old.   Ht Readings from Last 3 Encounters:  02/12/18 5' 3.15" (1.604 m)  07/20/17 5' 3.19" (1.605 m)  02/13/17 5' 2.8" (1.595 m)   Wt Readings from Last 3 Encounters:  02/12/18 122 lb 9.6 oz (55.6 kg)  07/20/17 115 lb 3.2 oz (52.3 kg)  02/13/17 120 lb 6.4 oz (54.6 kg)   HC Readings from Last 3 Encounters:  No data found for [facility]   Body surface area is  1.57 meters squared. Facility age limit for growth percentiles is 20 years. Facility age limit for growth percentiles is 20 years.    PHYSICAL EXAM:  General: Well developed, well nourished female in no acute distress.  Alert and oriented.  Head: Normocephalic, atraumatic.   Eyes:  Pupils equal and round. EOMI.   Sclera white.  No eye drainage.   Ears/Nose/Mouth/Throat: Nares patent, no nasal drainage.  Normal dentition, mucous membranes moist.   Neck: supple, no cervical lymphadenopathy, no thyromegaly Cardiovascular: regular rate, normal S1/S2, no murmurs Respiratory: No increased work of breathing.  Lungs clear to auscultation bilaterally.  No wheezes. Abdomen:  soft, nontender, nondistended. Normal bowel sounds.  No appreciable masses  Extremities: warm, well perfused, cap refill < 2 sec.   Musculoskeletal: Normal muscle mass.  Normal strength Skin: warm, dry.  No rash or lesions. Neurologic: alert and oriented, normal speech, no tremor   LAB DATA: Due today    Assessment and Plan:   ASSESSMENT:  1. Hypothyroid: She is clinically and biochemically euthyroid on 75 mcg of levothyroxine. Her TSH is upper end of normal but she also has a high normal FT4. Will continue to monitor on current levothyroxine dose.    PLAN:  1. Diagnostic: TSH and FT4 as above. Repeat at next visit.  2. Therapeutic: 75 mcg of levothyroxine per day   3. Patient education: Reviewed growth chart. Discussed signs and symptoms of hypothyroid. Advised to take Levothyroxine every morning on empty stomach. Answered questions.  4. Follow-up: 4 months ( spring break.   LOS: this visit lasted >25 minutes. More then 50% of the visit was devoted to counseling.    Gretchen ShortSpenser Idonna Heeren, FNP-C

## 2018-02-12 NOTE — Patient Instructions (Signed)
Continue 75 mcg of levothyroxine per day  Call if you develop any symptoms.  Follo wup in 4 months/ spring break

## 2018-06-06 ENCOUNTER — Other Ambulatory Visit (INDEPENDENT_AMBULATORY_CARE_PROVIDER_SITE_OTHER): Payer: Self-pay

## 2018-06-06 DIAGNOSIS — E063 Autoimmune thyroiditis: Secondary | ICD-10-CM

## 2018-06-11 ENCOUNTER — Encounter (INDEPENDENT_AMBULATORY_CARE_PROVIDER_SITE_OTHER): Payer: Self-pay | Admitting: *Deleted

## 2018-06-11 ENCOUNTER — Telehealth (INDEPENDENT_AMBULATORY_CARE_PROVIDER_SITE_OTHER): Payer: Self-pay | Admitting: Family

## 2018-06-11 ENCOUNTER — Other Ambulatory Visit (INDEPENDENT_AMBULATORY_CARE_PROVIDER_SITE_OTHER): Payer: Self-pay | Admitting: *Deleted

## 2018-06-11 DIAGNOSIS — E063 Autoimmune thyroiditis: Secondary | ICD-10-CM

## 2018-06-11 NOTE — Telephone Encounter (Signed)
Spoke to patient, advised lab changed, also we are mailing her a letter with instructions to acitvate Mychart. She voiced understanding.

## 2018-06-11 NOTE — Telephone Encounter (Signed)
°  Who's calling (name and relationship to patient) : Lucianne Muss (Self)  Best contact number: [phone] Provider they see: Ovidio Kin Reason for call: Pt wanted to know if lab orders could be sent to [facility] instead of [facility].

## 2018-06-12 LAB — TSH: TSH: 3.02 u[IU]/mL (ref 0.450–4.500)

## 2018-06-12 LAB — T4, FREE: Free T4: 1.24 ng/dL (ref 0.82–1.77)

## 2018-06-14 ENCOUNTER — Encounter (INDEPENDENT_AMBULATORY_CARE_PROVIDER_SITE_OTHER): Payer: Self-pay | Admitting: Family

## 2018-06-14 ENCOUNTER — Other Ambulatory Visit: Payer: Self-pay

## 2018-06-14 ENCOUNTER — Ambulatory Visit (INDEPENDENT_AMBULATORY_CARE_PROVIDER_SITE_OTHER): Payer: BLUE CROSS/BLUE SHIELD | Admitting: Family

## 2018-06-14 DIAGNOSIS — E063 Autoimmune thyroiditis: Secondary | ICD-10-CM

## 2018-06-14 NOTE — Patient Instructions (Signed)
75 mcg of levothyroxine  6 month follow up

## 2018-06-14 NOTE — Progress Notes (Signed)
This is a Pediatric Specialist E-Visit follow up consult provided via  WebEx Monique Sharp  consented to an E-Visit consult today.  Location of patient: Monique Sharp is at home  Location of provider: Gretchen ShortSpenser Basya Casavant FNP is at Pediatric office  Patient was referred by Gillian ScarceZanard, Robyn K, MD   The following participants were involved in this E-Visit:Koraline Allena KatzPatel, Mertie MooresJaime Slemons, RMA Gretchen ShortSpenser Earl Zellmer FNP Chief Complain/ Reason for E-Visit today: Hypothyroidism follow up  Total time on call: This visit lasted >15 minutes. More then 50% of the visit was devoted to counseling.  Follow up: 6 months.    Subjective:  Patient Name: Monique Sharp Date of Birth: 07/24/1995  MRN: [id number]  Monique Sharp  presents to the office today for follow-up evaluation and management of her hypothyroidism and hashimoto's thyroiditis  HISTORY OF PRESENT ILLNESS:   Monique Sharp is a 23 y.o. [place] female     1. Monique Sharp [facility] At that visit they discussed her complaints of weight loss, foot cramps, and hair loss. Her PCP obtained lab work which was remarkable for a TSH of 12.7. She did not have follow up labs and was not started on therapy. She was referred to endocrinology for further evaluation and management. Repeat labs drawn at her initial visit in 7/14 were remarkable for a TSH of 10.36 with a TPO ab of [id number] (nml <35) and a TGA of 4284 (nml <45).    2. The patient's last PSSG visit was on 01/2018. In the interim, she has been generally healthy.   She is finishing her last semester of college. She was sent home due to COVID 19 outbreak and is finishing all of her classes online. She is exercising daily to stay busy but otherwise staying isolated. She reports that she takes 75 mcg of Levothyroxine per day, no missed doses. Denies fatigue, constipation and cold intolerance.   3. Pertinent Review of Systems:  All systems reviewed with pertinent positives listed below; otherwise  negative. Constitutional: She reports good energy and appetite. Sleeping well.  HEENT: No difficulty swallowing. No neck pain.  Respiratory: No increased work of breathing currently Cardiac: no palpitations, no tachycardia.  GI: No constipation or diarrhea Musculoskeletal: No joint deformity Neuro: Normal affect Endocrine: As above   PAST MEDICAL, FAMILY, AND SOCIAL HISTORY  Past Medical History:  Diagnosis Date  . Hair loss     Family History  Problem Relation Age of Onset  . Thyroid disease Neg Hx      Current Outpatient Medications:  .  levothyroxine (SYNTHROID, LEVOTHROID) 75 MCG tablet, Take 1 tablet (75 mcg total) by mouth daily., Disp: 30 tablet, Rfl: 6  Allergies as of 06/14/2018  . (No Known Allergies)     reports that she has never smoked. She has never used smokeless tobacco. She reports that she does not drink alcohol or use drugs. Pediatric History  Patient Parents  . Stencel,[name] (Mother)  . Ellerbrock,[name] (Father)   Other Topics Concern  . Not on file  Social History Narrative   Lives with parents and sister   Senior at [employer]  Primary Care Provider: Gillian ScarceZanard, Robyn K, MD  ROS: There are no other significant problems involving Ardine's other body systems.   Objective:  Vital Signs:  LMP 05/26/2018 (Approximate)  Growth percentile SmartLinks can only be used for patients less than 22 years old.   Ht Readings from Last 3 Encounters:  02/12/18 5' 3.15" (1.604 m)  07/20/17 5'  3.19" (1.605 m)  02/13/17 5' 2.8" (1.595 m)   Wt Readings from Last 3 Encounters:  02/12/18 122 lb 9.6 oz (55.6 kg)  07/20/17 115 lb 3.2 oz (52.3 kg)  02/13/17 120 lb 6.4 oz (54.6 kg)   HC Readings from Last 3 Encounters:  No data found for HC   There is no height or weight on file to calculate BSA. Facility age limit for growth percentiles is 20 years. Facility age limit for growth percentiles is 20 years.    PHYSICAL EXAM:  General: Well developed, well nourished  female in no acute distress.  Alert and oriented.  Head: Normocephalic, atraumatic.   Eyes:  Pupils equal and round. EOMI.   Sclera white.  No eye drainage.   Ears/Nose/Mouth/Throat: Nares patent, no nasal drainage.  Normal dentition,  Neck: supple, no cervical lymphadenopathy, no obvious thyromegaly Cardiovascular: No cyanosis.  Respiratory: No increased work of breathing.   Skin: warm, dry.  No rash or lesions. Neurologic: alert and oriented, normal speech, no tremor   LAB DATA:  Results for orders placed or performed in visit on 06/11/18  T4, free  Result Value Ref Range   Free T4 1.24 0.82 - 1.77 ng/dL  TSH  Result Value Ref Range   TSH 3.020 0.450 - 4.500 uIU/mL      Assessment and Plan:   ASSESSMENT:  1. Hypothyroid: She is clinically and biochemically euthyroid on 75 mcg of levothyroxine per day.    PLAN:  1. Diagnostic: TSH and FT4 as above. Repeat at next visit.  2. Therapeutic: Take 75 mcg of levothyroxine per day  3. Patient education: reviewed labs. Discussed signs and symptoms of hypothyroidism and advised to contact us if she feels different. Encouraged to stay isolated duing COVID 19. Answered questions.  4. Follow-up: 6 months.     Gretchen Short,  FNP-C  Pediatric Specialist  [address]  [place], [address]  Tele: [phone]

## 2018-11-07 ENCOUNTER — Other Ambulatory Visit (INDEPENDENT_AMBULATORY_CARE_PROVIDER_SITE_OTHER): Payer: Self-pay | Admitting: Family

## 2018-11-07 DIAGNOSIS — E063 Autoimmune thyroiditis: Secondary | ICD-10-CM

## 2018-11-08 ENCOUNTER — Other Ambulatory Visit (INDEPENDENT_AMBULATORY_CARE_PROVIDER_SITE_OTHER): Payer: Self-pay | Admitting: *Deleted

## 2018-11-08 DIAGNOSIS — E063 Autoimmune thyroiditis: Secondary | ICD-10-CM

## 2018-11-08 MED ORDER — LEVOTHYROXINE SODIUM 75 MCG PO TABS: 75.0000 ug | ORAL_TABLET | Freq: Every day | ORAL | 1 refills | Status: DC

## 2018-12-16 ENCOUNTER — Ambulatory Visit (INDEPENDENT_AMBULATORY_CARE_PROVIDER_SITE_OTHER): Payer: BLUE CROSS/BLUE SHIELD | Admitting: Family

## 2018-12-23 ENCOUNTER — Encounter (INDEPENDENT_AMBULATORY_CARE_PROVIDER_SITE_OTHER): Payer: Self-pay | Admitting: Family

## 2019-01-01 ENCOUNTER — Ambulatory Visit (INDEPENDENT_AMBULATORY_CARE_PROVIDER_SITE_OTHER): Payer: BLUE CROSS/BLUE SHIELD | Admitting: Family

## 2019-01-06 ENCOUNTER — Telehealth (INDEPENDENT_AMBULATORY_CARE_PROVIDER_SITE_OTHER): Payer: Self-pay | Admitting: Radiology

## 2019-01-06 ENCOUNTER — Other Ambulatory Visit (INDEPENDENT_AMBULATORY_CARE_PROVIDER_SITE_OTHER): Payer: Self-pay | Admitting: *Deleted

## 2019-01-06 DIAGNOSIS — E063 Autoimmune thyroiditis: Secondary | ICD-10-CM

## 2019-01-06 NOTE — Telephone Encounter (Signed)
  Who's calling (name and relationship to patient) : Garret Reddish   Best contact number: [phone]  Provider they see: Hermenia Bers   Reason for call: Patient called to request her lab orders be sent to [facility] near her before her next appointment on 11/12. Address below   [facility] [address]  [address] [place], [place] [address]  PRESCRIPTION REFILL ONLY  Name of prescription:  Pharmacy:

## 2019-01-06 NOTE — Telephone Encounter (Signed)
Advises labs placed for [facility] as requested.

## 2019-01-07 LAB — TSH: TSH: 2.84 u[IU]/mL (ref 0.450–4.500)

## 2019-01-07 LAB — T4, FREE: Free T4: 1.43 ng/dL (ref 0.82–1.77)

## 2019-01-07 LAB — T4: T4, Total: 8.6 ug/dL (ref 4.5–12.0)

## 2019-01-09 ENCOUNTER — Ambulatory Visit (INDEPENDENT_AMBULATORY_CARE_PROVIDER_SITE_OTHER): Payer: BLUE CROSS/BLUE SHIELD | Admitting: Family

## 2019-01-09 ENCOUNTER — Other Ambulatory Visit: Payer: Self-pay

## 2019-01-09 ENCOUNTER — Encounter (INDEPENDENT_AMBULATORY_CARE_PROVIDER_SITE_OTHER): Payer: Self-pay | Admitting: Family

## 2019-01-09 DIAGNOSIS — E063 Autoimmune thyroiditis: Secondary | ICD-10-CM

## 2019-01-09 NOTE — Progress Notes (Signed)
This is a Pediatric Specialist E-Visit follow up consult provided via   WebEx Bonni Neuser  consented to an E-Visit consult today.  Location of patient: Brinda is at home Location of provider: Crist [name] is at Pediatric Specialist office Patient was referred by Gillian Scarce, MD   The following participants were involved in this E-Visit: [name], RMA Gretchen Short, FNP [name] Chief Complain/ Reason for E-Visit today: Hypothyroidism  Total time on call: This visit lasted >15 minutes. More then 50% of the visit was devoted to counseling.  Follow up: 6 months.     Subjective:  Patient Name: Catrinia Racicot Date of Birth: 1995-08-14  MRN: [id number]  Erynn Vaca  presents to the office today for follow-up evaluation and management of her hypothyroidism and hashimoto's thyroiditis  HISTORY OF PRESENT ILLNESS:   Patrick is a 23 y.o. [place] female     1. Keilani was seen by her pcp in February 2014 for her [facility]. At that visit they discussed her complaints of weight loss, foot cramps, and hair loss. Her PCP obtained lab work which was remarkable for a TSH of 12.7. She did not have follow up labs and was not started on therapy. She was referred to endocrinology for further evaluation and management. Repeat labs drawn at her initial visit in 7/14 were remarkable for a TSH of 10.36 with a TPO ab of 67341 (nml <35) and a TGA of 4284 (nml <45).    2. The patient's last PSSG visit was on 01/2018. In the interim, she has been generally healthy.   She has graduated from college and is taking a gap year. 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 She is taking 75 mcg of levothyroxine per day, denies missed doses. Denies experiencing fatigue, constipation and cold intolerance.   3. Pertinent Review of Systems:  All systems reviewed with pertinent positives listed below; otherwise negative. Constitutional: She reports good energy  and appetite. Sleeping well.  HEENT: No difficulty swallowing. No neck pain.  Respiratory: No increased work of breathing currently Cardiac: no palpitations, no tachycardia.  GI: No constipation or diarrhea Musculoskeletal: No joint deformity Neuro: Normal affect Endocrine: As above   PAST MEDICAL, FAMILY, AND SOCIAL HISTORY  Past Medical History:  Diagnosis Date  . Hair loss     Family History  Problem Relation Age of Onset  . Thyroid disease Neg Hx      Current Outpatient Medications:  .  levothyroxine (SYNTHROID) 75 MCG tablet, Take 1 tablet (75 mcg total) by mouth daily., Disp: 90 tablet, Rfl: 1  Allergies as of 01/09/2019  . (No Known Allergies)     reports that she has never smoked. She has never used smokeless tobacco. She reports that she does not drink alcohol or use drugs. Pediatric History  Patient Parents  . Ressler,[name] (Mother)  . Haller,[name] (Father)   Other Topics Concern  . Not on file  Social History Narrative   Lives with parents and sister   Applying to medical school   Primary Care Provider: Gillian Scarce, MD  ROS: There are no other significant problems involving Michaelina's other body systems.   Objective:  Vital Signs:  LMP 12/17/2018  Growth percentile SmartLinks can only be used for patients less than 37 years old.   Ht Readings from Last 3 Encounters:  02/12/18 5' 3.15" (1.604 m)  07/20/17 5' 3.19" (1.605 m)  02/13/17 5' 2.8" (1.595 m)  Wt Readings from Last 3 Encounters:  02/12/18 122 lb 9.6 oz (55.6 kg)  07/20/17 115 lb 3.2 oz (52.3 kg)  02/13/17 120 lb 6.4 oz (54.6 kg)   HC Readings from Last 3 Encounters:  No data found for HC   There is no height or weight on file to calculate BSA. Facility age limit for growth percentiles is 20 years. Facility age limit for growth percentiles is 20 years.    PHYSICAL EXAM:  General: Well developed, well nourished female in no acute distress.  Alert and oriented.  Head:  Normocephalic, atraumatic.   Eyes:  Pupils equal and round. EOMI.   Sclera white.  No eye drainage.   Ears/Nose/Mouth/Throat: Nares patent, no nasal drainage.  Normal dentition, mucous membranes moist.   Neck: supple, no obvious thyromegaly Cardiovascular: No cyanosis.  Respiratory: No increased work of breathing.   Skin: warm, dry.  No rash or lesions. Neurologic: alert and oriented, normal speech, no tremor    LAB DATA:  Results for orders placed or performed in visit on 01/06/19  T4  Result Value Ref Range   T4, Total 8.6 4.5 - 12.0 ug/dL  T4, free  Result Value Ref Range   Free T4 1.43 0.82 - 1.77 ng/dL  TSH  Result Value Ref Range   TSH 2.840 0.450 - 4.500 uIU/mL      Assessment and Plan:   ASSESSMENT:  1. Hypothyroid: Her thyroid labs show the she is euthyroid, she is also clinically euthyroid on 75 mcg of levothyroxine per day.    PLAN:  1. Diagnostic: TSH and FT4 as above. Repeat at next visit.  2. Therapeutic: 75 mcg of levothyroxine per day.  3. Patient education: Reviewed and discussed lab results. Discussed signs and symptoms of both hypothyroidism and hyperthyroidism. Encouraged to contact me with any questions or concerns between visits. Answered questions.   4. Follow-up: 6 months.     Hermenia Bers,  FNP-C  Pediatric Specialist  [address] [place]  [place], [address]  Tele: [phone]

## 2019-01-09 NOTE — Patient Instructions (Signed)
-  Signs of hypothyroidism (underactive thyroid) include increased sleep, sluggishness, weight gain, and constipation. -Signs of hyperthyroidism (overactive thyroid) include difficulty sleeping, diarrhea, heart racing, weight loss, or irritability  Please let me know if you develop any of these symptoms so we can repeat your thyroid tests.  

## 2019-05-11 ENCOUNTER — Other Ambulatory Visit (INDEPENDENT_AMBULATORY_CARE_PROVIDER_SITE_OTHER): Payer: Self-pay | Admitting: Family

## 2019-05-11 DIAGNOSIS — E063 Autoimmune thyroiditis: Secondary | ICD-10-CM

## 2019-06-21 ENCOUNTER — Ambulatory Visit: Payer: Self-pay

## 2019-07-10 ENCOUNTER — Ambulatory Visit (INDEPENDENT_AMBULATORY_CARE_PROVIDER_SITE_OTHER): Payer: BLUE CROSS/BLUE SHIELD | Admitting: Family

## 2019-07-18 ENCOUNTER — Telehealth (INDEPENDENT_AMBULATORY_CARE_PROVIDER_SITE_OTHER): Payer: Self-pay | Admitting: Family

## 2019-07-18 DIAGNOSIS — E063 Autoimmune thyroiditis: Secondary | ICD-10-CM

## 2019-07-18 NOTE — Telephone Encounter (Signed)
Labs sent to [facility].   Attempted on 2 different phones to contact patient, both times mom picked up, but was unable to hear [place]. Will send a MyChart message.

## 2019-07-18 NOTE — Telephone Encounter (Signed)
Please send all needed labs to [facility] for patient

## 2019-07-23 LAB — TSH: TSH: 4.15 u[IU]/mL (ref 0.450–4.500)

## 2019-07-23 LAB — T4, FREE: Free T4: 1.41 ng/dL (ref 0.82–1.77)

## 2019-07-23 LAB — T4: T4, Total: 8.8 ug/dL (ref 4.5–12.0)

## 2019-07-24 ENCOUNTER — Encounter (INDEPENDENT_AMBULATORY_CARE_PROVIDER_SITE_OTHER): Payer: Self-pay | Admitting: Family

## 2019-07-24 ENCOUNTER — Ambulatory Visit (INDEPENDENT_AMBULATORY_CARE_PROVIDER_SITE_OTHER): Payer: Self-pay | Admitting: Family

## 2019-07-24 ENCOUNTER — Other Ambulatory Visit: Payer: Self-pay

## 2019-07-24 VITALS — BP 110/72 | HR 80 | Ht 62.6 in | Wt 118.0 lb

## 2019-07-24 DIAGNOSIS — E063 Autoimmune thyroiditis: Secondary | ICD-10-CM

## 2019-07-24 MED ORDER — LEVOTHYROXINE SODIUM 75 MCG PO TABS: 75.0000 ug | ORAL_TABLET | Freq: Every day | ORAL | 2 refills | Status: DC

## 2019-07-24 NOTE — Progress Notes (Signed)
This is a Pediatric Specialist E-Visit follow up consult provided via   WebEx Monique Sharp  consented to an E-Visit consult today.  Location of patient: [name] is at home Location of provider: Crist Sharp is at Pediatric Specialist office Patient was referred by Monique Scarce, MD   The following participants were involved in this E-Visit: Monique Sharp, RMA Monique Short, FNP Monique Sharp- patient Chief Complain/ Reason for E-Visit today: Hypothyroidism  Total time on call: This visit lasted >15 minutes. More then 50% of the visit was devoted to counseling.  Follow up: 6 months.     Subjective:  Patient Name: Monique Sharp Date of Birth: [birth year]  MRN: [id number]  Monique Sharp  presents to the office today for follow-up evaluation and management of her hypothyroidism and hashimoto's thyroiditis  HISTORY OF PRESENT ILLNESS:   Monique Sharp is a 24 y.o. [place] female     1. Monique Sharp was seen by her pcp in February 2014 for her [facility]. At that visit they discussed her complaints of weight loss, foot cramps, and hair loss. Her PCP obtained lab work which was remarkable for a TSH of 12.7. She did not have follow up labs and was not started on therapy. She was referred to endocrinology for further evaluation and management. Repeat labs drawn at her initial visit in 7/14 were remarkable for a TSH of 10.36 with a TPO ab of 19622 (nml <35) and a TGA of 4284 (nml <45).    2. The patient's last PSSG visit was on . In the interim, she has been generally healthy.   Taking 75 mcg of levothyroxine per day, denies missed doses. No fatigue, constipation or cold intolerance. She will need to transfer care to [facility] due to insurance.   3. Pertinent Review of Systems:  All systems reviewed with pertinent positives listed below; otherwise negative. Constitutional: She reports good energy and appetite. Sleeping well.  HEENT: No difficulty swallowing. No neck pain.  Respiratory: No increased work of  breathing currently Cardiac: no palpitations, no tachycardia.  GI: No constipation or diarrhea Musculoskeletal: No joint deformity Neuro: Normal affect Endocrine: As above   PAST MEDICAL, FAMILY, AND SOCIAL HISTORY  Past Medical History:  Diagnosis Date  . Hair loss     Family History  Problem Relation Age of Onset  . Thyroid disease Neg Hx      Current Outpatient Medications:  .  levothyroxine (SYNTHROID) 75 MCG tablet, Take 1 tablet by mouth once daily, Disp: 90 tablet, Rfl: 0  Allergies as of 07/24/2019  . (No Known Allergies)     reports that she has never smoked. She has never used smokeless tobacco. She reports that she does not drink alcohol or use drugs. Pediatric History  Patient Parents  . Monique Sharp (Mother)  . Monique Sharp (Father)   Other Topics Concern  . Not on file  Social History Narrative   Lives with parents and sister   Applying to medical school   Primary Care Provider: Gillian Scarce, MD  ROS: There are no other significant problems involving Monique Sharp other body systems.   Objective:  Vital Signs:  BP 110/72   Pulse 80   Ht 5' 2.6" (1.59 m)   Wt 118 lb (53.5 kg)   LMP 07/17/2019 (Exact Date)   BMI 21.17 kg/m  Growth percentile SmartLinks can only be used for patients less than 41 years old.   Ht Readings from Last 3 Encounters:  07/24/19 5' 2.6" (1.59 m)  02/12/18 5' 3.15" (  1.604 m)  07/20/17 5' 3.19" (1.605 m)   Wt Readings from Last 3 Encounters:  07/24/19 118 lb (53.5 kg)  02/12/18 122 lb 9.6 oz (55.6 kg)  07/20/17 115 lb 3.2 oz (52.3 kg)   HC Readings from Last 3 Encounters:  No data found for [facility]   Body surface area is 1.54 meters squared. Facility age limit for growth percentiles is 20 years. Facility age limit for growth percentiles is 20 years.    PHYSICAL EXAM:  General: Well developed, well nourished female in no acute distress.   Head: Normocephalic, atraumatic.   Eyes:  Pupils equal and round. EOMI.    Sclera white.  No eye drainage.   Ears/Nose/Mouth/Throat: Nares patent, no nasal drainage.  Normal dentition, mucous membranes moist.   Neck: supple, no cervical lymphadenopathy, no thyromegaly Cardiovascular: regular rate, normal S1/S2, no murmurs Respiratory: No increased work of breathing.  Lungs clear to auscultation bilaterally.  No wheezes. Abdomen: soft, nontender, nondistended. Normal bowel sounds.  No appreciable masses  Extremities: warm, well perfused, cap refill < 2 sec.   Musculoskeletal: Normal muscle mass.  Normal strength Skin: warm, dry.  No rash or lesions. Neurologic: alert and oriented, normal speech, no tremor     LAB DATA:  Results for orders placed or performed in visit on 07/18/19  T4, free  Result Value Ref Range   Free T4 1.41 0.82 - 1.77 ng/dL  TSH  Result Value Ref Range   TSH 4.150 0.450 - 4.500 uIU/mL  T4  Result Value Ref Range   T4, Total 8.8 4.5 - 12.0 ug/dL      Assessment and Plan:   ASSESSMENT:  1. Hypothyroid:  She is clinically and biochemically euthyroid on 75 mcg of levothyroxine per day.    PLAN:  1. Diagnostic: Reviewed labs.  2. Therapeutic: 75 mcg of levothyroxine per day.  3. Patient education: Reviewed and discussed lab results. Discussed signs and symptoms of both hypothyroidism and hyperthyroidism. Discussed transition to adult endocrinology care. She will follow up with [facility] endocrine.  4. Follow-up: 6 months.   >30 spent today reviewing the medical chart, counseling the patient/family, and documenting today's visit.    Hermenia Bers,  FNP-C  Pediatric Specialist  [address] [place]  [place], [address]  Tele: [phone]

## 2019-07-24 NOTE — Patient Instructions (Signed)
-  Signs of hypothyroidism (underactive thyroid) include increased sleep, sluggishness, weight gain, and constipation. -Signs of hyperthyroidism (overactive thyroid) include difficulty sleeping, diarrhea, heart racing, weight loss, or irritability  Please let me know if you develop any of these symptoms so we can repeat your thyroid tests.   Continue 75 mcg of levothyroxine per day.

## 2020-05-03 ENCOUNTER — Telehealth (INDEPENDENT_AMBULATORY_CARE_PROVIDER_SITE_OTHER): Payer: Self-pay | Admitting: Family

## 2020-05-03 DIAGNOSIS — E063 Autoimmune thyroiditis: Secondary | ICD-10-CM

## 2020-05-03 NOTE — Telephone Encounter (Signed)
Referral placed. Records sent.

## 2020-05-03 NOTE — Telephone Encounter (Signed)
Who's calling (name and relationship to patient) : Monique Sharp   Best contact number: [phone]  Provider they see: Gretchen Short  Reason for call: Patient would like a referral placed for [facility]   Call ID:      PRESCRIPTION REFILL ONLY  Name of prescription:  Pharmacy:

## 2020-05-13 ENCOUNTER — Other Ambulatory Visit (INDEPENDENT_AMBULATORY_CARE_PROVIDER_SITE_OTHER): Payer: Self-pay | Admitting: Family

## 2020-05-13 DIAGNOSIS — E063 Autoimmune thyroiditis: Secondary | ICD-10-CM

## 2020-05-14 ENCOUNTER — Encounter (INDEPENDENT_AMBULATORY_CARE_PROVIDER_SITE_OTHER): Payer: Self-pay

## 2020-05-14 NOTE — Telephone Encounter (Signed)
New patient appointment with [facility] scheduled 06/25/20. Refills sent to get her through to appointment.

## 2020-06-01 ENCOUNTER — Ambulatory Visit (INDEPENDENT_AMBULATORY_CARE_PROVIDER_SITE_OTHER): Payer: 59 | Admitting: Family Medicine

## 2020-06-01 ENCOUNTER — Other Ambulatory Visit: Payer: Self-pay

## 2020-06-01 ENCOUNTER — Encounter: Payer: Self-pay | Admitting: Family Medicine

## 2020-06-01 VITALS — BP 108/76 | HR 82 | Temp 98.6°F | Ht 62.0 in | Wt 125.2 lb

## 2020-06-01 DIAGNOSIS — Z111 Encounter for screening for respiratory tuberculosis: Secondary | ICD-10-CM

## 2020-06-01 DIAGNOSIS — E063 Autoimmune thyroiditis: Secondary | ICD-10-CM

## 2020-06-01 DIAGNOSIS — Z0000 Encounter for general adult medical examination without abnormal findings: Secondary | ICD-10-CM

## 2020-06-01 LAB — COMPREHENSIVE METABOLIC PANEL
ALT: 12 U/L (ref 0–35)
AST: 15 U/L (ref 0–37)
Albumin: 4.5 g/dL (ref 3.5–5.2)
Alkaline Phosphatase: 53 U/L (ref 39–117)
BUN: 8 mg/dL (ref 6–23)
CO2: 27 mEq/L (ref 19–32)
Calcium: 9.6 mg/dL (ref 8.4–10.5)
Chloride: 104 mEq/L (ref 96–112)
Creatinine, Ser: 0.62 mg/dL (ref 0.40–1.20)
GFR: 125.52 mL/min (ref >60.00)
Glucose, Bld: 76 mg/dL (ref 70–99)
Potassium: 4.4 mEq/L (ref 3.5–5.1)
Sodium: 138 mEq/L (ref 135–145)
Total Bilirubin: 0.5 mg/dL (ref 0.2–1.2)
Total Protein: 7.4 g/dL (ref 6.0–8.3)

## 2020-06-01 LAB — CBC
HCT: 35.2 % — ABNORMAL LOW (ref 36.0–46.0)
Hemoglobin: 11.1 g/dL — ABNORMAL LOW (ref 12.0–15.0)
MCHC: 31.6 g/dL (ref 30.0–36.0)
MCV: 76 fl — ABNORMAL LOW (ref 78.0–100.0)
Platelets: 293 10*3/uL (ref 150.0–400.0)
RBC: 4.64 Mil/uL (ref 3.87–5.11)
RDW: 18 % — ABNORMAL HIGH (ref 11.5–15.5)
WBC: 4.3 10*3/uL (ref 4.0–10.5)

## 2020-06-01 LAB — LIPID PANEL
Cholesterol: 215 mg/dL — ABNORMAL HIGH (ref 0–200)
HDL: 77 mg/dL (ref >39.00)
LDL Cholesterol: 129 mg/dL — ABNORMAL HIGH (ref 0–99)
NonHDL: 138.22
Total CHOL/HDL Ratio: 3
Triglycerides: 45 mg/dL (ref 0.0–149.0)
VLDL: 9 mg/dL (ref 0.0–40.0)

## 2020-06-01 LAB — T4, FREE: Free T4: 0.95 ng/dL (ref 0.60–1.60)

## 2020-06-01 LAB — TSH: TSH: 12.87 u[IU]/mL — ABNORMAL HIGH (ref 0.35–4.50)

## 2020-06-01 MED ORDER — LEVOTHYROXINE SODIUM 75 MCG PO TABS: 75.0000 ug | ORAL_TABLET | Freq: Every day | ORAL | 2 refills | Status: DC

## 2020-06-01 NOTE — Patient Instructions (Signed)
Call [facility] [place] at [phone] for an appointment.  They are located at [address], [address], [place], [place], [address] (right across the hall from our office).  Give us 2-3 business days to get the results of your labs back.   Keep the diet clean and stay active.  Let us know if you need anything. 

## 2020-06-01 NOTE — Progress Notes (Signed)
Chief Complaint  Patient presents with  . New Patient (Initial Visit)    Needs a physical for school     Well Woman Monique Sharp is here for a complete physical.   Her last physical was >1 year ago.  Current diet: in general, a "healthy" diet. Current exercise: yoga. Fatigue out of ordinary? No Seatbelt? Yes Loss of interested in doing things or depression in past 2 weeks? No  Health Maintenance Pap/HPV- Yes Tetanus- Yes HIV screening- No Hep C screening- No  Not currently sexually active.   Past Medical History:  Diagnosis Date  . Thyroid disease     Past Surgical History:  Procedure Laterality Date  . NO PAST SURGERIES      Medications  Current Outpatient Medications on File Prior to Visit  Medication Sig Dispense Refill  . levothyroxine (SYNTHROID) 75 MCG tablet Take 1 tablet by mouth once daily 30 tablet 1   Allergies No Known Allergies  Review of Systems: Constitutional:  no unexpected weight changes Eye:  no recent significant change in vision Ear/Nose/Mouth/Throat:  Ears:  no tinnitus or vertigo and no recent change in hearing Nose/Mouth/Throat:  no complaints of nasal congestion, no sore throat Cardiovascular: no chest pain Respiratory:  no cough and no shortness of breath Gastrointestinal:  no abdominal pain, no change in bowel habits GU:  Female: negative for dysuria or pelvic pain Musculoskeletal/Extremities:  no pain of the joints Integumentary (Skin/Breast):  no abnormal skin lesions reported Neurologic:  no headaches Endocrine:  denies fatigue Hematologic/Lymphatic:  No areas of easy bleeding  Exam BP 108/76 (BP Location: Left Arm, Patient Position: Sitting, Cuff Size: Normal)   Pulse 82   Temp 98.6 F (37 C) (Oral)   Ht 5\' 2"  (1.575 m)   Wt 125 lb 4 oz (56.8 kg)   SpO2 99%   BMI 22.91 kg/m  General:  well developed, well nourished, in no apparent distress Skin:  no significant moles, warts, or growths Head:  no masses, lesions, or  tenderness Eyes:  pupils equal and round, sclera anicteric without injection Ears:  canals without lesions, TMs shiny without retraction, no obvious effusion, no erythema Nose:  nares patent, septum midline, mucosa normal, and no drainage or sinus tenderness Throat/Pharynx:  lips and gingiva without lesion; tongue and uvula midline; non-inflamed pharynx; no exudates or postnasal drainage Neck: neck supple without adenopathy, thyromegaly, or masses Lungs:  clear to auscultation, breath sounds equal bilaterally, no respiratory distress Cardio:  regular rate and rhythm, no bruits, no LE edema Abdomen:  abdomen soft, nontender; bowel sounds normal; no masses or organomegaly Genital: Defer to GYN Musculoskeletal:  symmetrical muscle groups noted without atrophy or deformity Extremities:  no clubbing, cyanosis, or edema, no deformities, no skin discoloration Neuro:  gait normal; deep tendon reflexes normal and symmetric Psych: well oriented with normal range of affect and appropriate judgment/insight  Assessment and Plan  Well adult exam - Plan: CBC, Comprehensive metabolic panel, Lipid panel, HIV Antibody (routine testing w rflx), Hepatitis C antibody  Hypothyroidism, acquired, autoimmune - Plan: levothyroxine (SYNTHROID) 75 MCG tablet, TSH, T4, free  Screening-pulmonary TB - Plan: QuantiFERON-TB Gold Plus   Well 25 y.o. female. Counseled on diet and exercise. Form for school filled out.  GYN info provided in AVS.  Other orders as above. Follow up in 6 mo. The patient voiced understanding and agreement to the plan.  [address], DO 06/01/20 11:05 AM

## 2020-06-02 ENCOUNTER — Telehealth: Payer: Self-pay | Admitting: *Deleted

## 2020-06-02 NOTE — Telephone Encounter (Signed)
Unable to add iron, ibc, ferritin to in house labs.  Spoke with Norma Fredrickson at [facility] and test has been added for dx, E61.1.

## 2020-06-03 LAB — IRON,TIBC AND FERRITIN PANEL
%SAT: 5 % (calc) — ABNORMAL LOW (ref 16–45)
Ferritin: 2 ng/mL — ABNORMAL LOW (ref 16–154)
Iron: 25 ug/dL — ABNORMAL LOW (ref 40–190)
TIBC: 463 mcg/dL (calc) — ABNORMAL HIGH (ref 250–450)

## 2020-06-03 LAB — TEST AUTHORIZATION

## 2020-06-03 LAB — HEPATITIS C ANTIBODY
Hepatitis C Ab: NONREACTIVE
SIGNAL TO CUT-OFF: 0 (ref <1.00)

## 2020-06-03 LAB — HIV ANTIBODY (ROUTINE TESTING W REFLEX): HIV 1&2 Ab, 4th Generation: NONREACTIVE

## 2020-06-03 LAB — QUANTIFERON-TB GOLD PLUS
Mitogen-NIL: >10 IU/mL
NIL: 0.06 IU/mL
QuantiFERON-TB Gold Plus: NEGATIVE
TB1-NIL: <0 IU/mL
TB2-NIL: <0 IU/mL

## 2020-06-18 ENCOUNTER — Ambulatory Visit: Payer: Self-pay | Admitting: Internal Medicine

## 2020-06-25 ENCOUNTER — Ambulatory Visit: Payer: Self-pay | Admitting: Internal Medicine

## 2020-08-25 ENCOUNTER — Telehealth: Payer: Self-pay

## 2020-08-25 NOTE — Telephone Encounter (Signed)
Schedule appointment mychart for 08/27/20

## 2020-08-25 NOTE — Telephone Encounter (Signed)
Caller asks to be seen - messaged via MyChart re anxiety and mental health. Dr. Carmelia Roller advised to call to make appt.   Additional Comment Office hours provided. She cannot answer a callback from RN now - heading into class - and cannot talk much until 4 pm. Declined traige. Advised to callback

## 2020-08-27 ENCOUNTER — Encounter: Payer: Self-pay | Admitting: Family Medicine

## 2020-08-27 ENCOUNTER — Telehealth (INDEPENDENT_AMBULATORY_CARE_PROVIDER_SITE_OTHER): Payer: 59 | Admitting: Family Medicine

## 2020-08-27 ENCOUNTER — Other Ambulatory Visit: Payer: Self-pay

## 2020-08-27 DIAGNOSIS — F410 Panic disorder [episodic paroxysmal anxiety] without agoraphobia: Secondary | ICD-10-CM | POA: Diagnosis not present

## 2020-08-27 DIAGNOSIS — F411 Generalized anxiety disorder: Secondary | ICD-10-CM

## 2020-08-27 MED ORDER — CITALOPRAM HYDROBROMIDE 20 MG PO TABS
20.0000 mg | ORAL_TABLET | Freq: Every day | ORAL | 3 refills | Status: DC
Start: 2020-08-27 — End: 2021-02-09

## 2020-08-27 NOTE — Progress Notes (Signed)
Chief Complaint  Patient presents with   Anxiety    Subjective Monique Sharp is an 25 y.o. female who presents with anxiety. Due to COVID-19 pandemic, we are interacting via web portal for an electronic face-to-face visit. I verified patient's ID using 2 identifiers. Patient agreed to proceed with visit via this method. Patient is at home, I am at office. Patient and I are present for visit.   Symptoms began 2 mo ago again.  Anxiety symptoms: difficulty concentrating, fatigue, insomnia, palpitations, racing thoughts. Depressive symptoms None.  Family history significant for no psychiatric illness. Possible organic causes contributing are: none Social stressors include: School She does get panic attacks and high levels of anxiety when she starts taking tests. She has never been on anything before.  She is not following with a psychologist. She has been meditating and yoga that helps a little.   Past Medical History:  Diagnosis Date   Thyroid disease      Family History Family History  Problem Relation Age of Onset   Thyroid disease Neg Hx    Cancer Neg Hx     Exam No conversational dyspnea Age appropriate judgment and insight Nml affect and mood  Assessment and Plan  GAD (generalized anxiety disorder) - Plan: citalopram (CELEXA) 20 MG tablet  Panic attack  Chronic, new. Start Celexa 10 mg daily for 2 weeks and then increase to 20 mg daily. Letter given for school to allow her more time to complete testing.  This would decrease her anxiety/panic attacks. Counseled on adjunctive treatment with exercise/physical activity. She is not pregnant nor is she attempting to get pregnant.  Number for [facility] provided in message. F/u in 6 weeks. Patient voiced understanding and agreement to the plan.  Jilda Roche [place], DO 08/27/20 9:56 AM

## 2020-12-27 ENCOUNTER — Telehealth: Payer: 59 | Admitting: Family Medicine

## 2021-02-09 ENCOUNTER — Ambulatory Visit (INDEPENDENT_AMBULATORY_CARE_PROVIDER_SITE_OTHER): Payer: 59 | Admitting: Family Medicine

## 2021-02-09 ENCOUNTER — Encounter: Payer: Self-pay | Admitting: Family Medicine

## 2021-02-09 VITALS — BP 108/71 | HR 84 | Temp 97.9°F | Ht 62.0 in | Wt 123.5 lb

## 2021-02-09 DIAGNOSIS — R55 Syncope and collapse: Secondary | ICD-10-CM | POA: Diagnosis not present

## 2021-02-09 DIAGNOSIS — E063 Autoimmune thyroiditis: Secondary | ICD-10-CM | POA: Diagnosis not present

## 2021-02-09 LAB — COMPREHENSIVE METABOLIC PANEL
ALT: 11 U/L (ref 0–35)
AST: 15 U/L (ref 0–37)
Albumin: 4.6 g/dL (ref 3.5–5.2)
Alkaline Phosphatase: 57 U/L (ref 39–117)
BUN: 5 mg/dL — ABNORMAL LOW (ref 6–23)
CO2: 28 mEq/L (ref 19–32)
Calcium: 10.1 mg/dL (ref 8.4–10.5)
Chloride: 103 mEq/L (ref 96–112)
Creatinine, Ser: 0.65 mg/dL (ref 0.40–1.20)
GFR: 123.5 mL/min (ref >60.00)
Glucose, Bld: 85 mg/dL (ref 70–99)
Potassium: 4.4 mEq/L (ref 3.5–5.1)
Sodium: 139 mEq/L (ref 135–145)
Total Bilirubin: 0.5 mg/dL (ref 0.2–1.2)
Total Protein: 7.8 g/dL (ref 6.0–8.3)

## 2021-02-09 LAB — IBC + FERRITIN
Ferritin: 3.5 ng/mL — ABNORMAL LOW (ref 10.0–291.0)
Iron: 22 ug/dL — ABNORMAL LOW (ref 42–145)
Saturation Ratios: 4.4 % — ABNORMAL LOW (ref 20.0–50.0)
TIBC: 505.4 ug/dL — ABNORMAL HIGH (ref 250.0–450.0)
Transferrin: 361 mg/dL — ABNORMAL HIGH (ref 212.0–360.0)

## 2021-02-09 LAB — CBC
HCT: 40.1 % (ref 36.0–46.0)
Hemoglobin: 12.6 g/dL (ref 12.0–15.0)
MCHC: 31.4 g/dL (ref 30.0–36.0)
MCV: 80.1 fl (ref 78.0–100.0)
Platelets: 336 10*3/uL (ref 150.0–400.0)
RBC: 5 Mil/uL (ref 3.87–5.11)
RDW: 18.3 % — ABNORMAL HIGH (ref 11.5–15.5)
WBC: 5.9 10*3/uL (ref 4.0–10.5)

## 2021-02-09 LAB — TSH: TSH: 6.67 u[IU]/mL — ABNORMAL HIGH (ref 0.35–5.50)

## 2021-02-09 LAB — T4, FREE: Free T4: 0.95 ng/dL (ref 0.60–1.60)

## 2021-02-09 MED ORDER — LEVOTHYROXINE SODIUM 75 MCG PO TABS: 75.0000 ug | ORAL_TABLET | Freq: Every day | ORAL | 2 refills | Status: DC

## 2021-02-09 NOTE — Progress Notes (Signed)
Chief Complaint  Patient presents with   Follow-up    thyroid    Subjective: Patient is a 25 y.o. female here for f/u.  Hypothyroidism Patient presents for follow-up of hypothyroidism.  Reports compliance with medication- levothyroxine 75 mcg/d. Current symptoms include: denies fatigue, weight changes, heat/cold intolerance, bowel/skin changes or CVS symptoms She believes her dose should be not significantly changed  6 weeks ago was on her clinicals and lost consciousness. Did not hit her head, bite her tongue, lose bowel/bladder control, convulse, or experience anything prior to the episode.  There is trying to get IV access but she has done this before.  Past Medical History:  Diagnosis Date   Thyroid disease     Objective: BP 108/71    Pulse 84    Temp 97.9 F (36.6 C) (Oral)    Ht 5\' 2"  (1.575 m)    Wt 123 lb 8 oz (56 kg)    SpO2 99%    BMI 22.59 kg/m  General: Awake, appears stated age Heart: RRR, no LE edema, no bruits Lungs: CTAB, no rales, wheezes or rhonchi. No accessory muscle use Neck: Symmetric, supple, no thyromegaly noted Psych: Age appropriate judgment and insight, normal affect and mood  Assessment and Plan: Hypothyroidism, acquired, autoimmune - Plan: levothyroxine (SYNTHROID) 75 MCG tablet, TSH, T4, free  Syncope, unspecified syncope type - Plan: CBC, Comprehensive metabolic panel, IBC + Ferritin  Chronic, stable.  Check labs.  Continue Synthroid 75 mcg daily. New problem, uncertain prognosis, work-up planned.  Check above labs.  She has a history of iron deficiency.  Could be related to that.  There is a list of iron rich foods that was provided in her paperwork. Follow-up in 6 months for physical or as needed. The patient voiced understanding and agreement to the plan.  [place], DO 02/09/21  9:31 AM

## 2021-02-09 NOTE — Patient Instructions (Addendum)
Give [place] 2-3 business days to get the results of your labs back.   Iron Rich foods:  Red meat Prunes Spinach Liver Cream of Wheat  Moderate sources: Iron-fortified cereals (bran, etc) Almonds Beans/peas Pork Lamb Ham Scallops [place] Peaches Peanuts  Please consider counseling. Contact [phone] to schedule an appointment or inquire about cost/insurance coverage.  [facility] located at [address], [address], [place], [place].  Phone number = [phone].  Dr. Regan Lemming - Adult Psychiatry.    [facility] located at [address] [place], [place], [place]. Phone number = [phone].   [facility] located at [address], [place], [place].  Phone number = [phone].   [facility] located at [address] [place], [place], [place].  Phone number = [phone].  Let us know if you need anything.

## 2021-05-02 ENCOUNTER — Encounter: Payer: Self-pay | Admitting: Family Medicine

## 2021-05-02 ENCOUNTER — Telehealth: Payer: Self-pay | Admitting: Family Medicine

## 2021-05-02 ENCOUNTER — Telehealth (INDEPENDENT_AMBULATORY_CARE_PROVIDER_SITE_OTHER): Payer: 59 | Admitting: Family Medicine

## 2021-05-02 DIAGNOSIS — Z111 Encounter for screening for respiratory tuberculosis: Secondary | ICD-10-CM

## 2021-05-02 NOTE — Telephone Encounter (Signed)
Pt is needing a blood TB test, and is hoping dr. Carmelia Roller can send this to [facility]. Please advise.  ?[facility] ?[address], [place], [place] [address] ?[phone] ?

## 2021-05-02 NOTE — Telephone Encounter (Signed)
Patient needs an appt with Dr. Carmelia Roller for this ?

## 2021-05-02 NOTE — Progress Notes (Signed)
Chief Complaint  ?Patient presents with  ? Follow-up  ?  TB test  ? ? ?Subjective: ?Patient is a 26 y.o. female here for requesting Tb test. Due to COVID-19 pandemic, we are interacting via web portal for an electronic face-to-face visit. I verified patient's ID using 2 identifiers. Patient agreed to proceed with visit via this method. Patient is at home, I am at office. Patient and I are present for visit.  ? 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  She had a positive test in the past but was also received the BCG vaccination.  She requires the QuantiFERON gold test.  She is in town for spring break through the week.  No cough, weight loss, or night sweats. ? ? ?Past Medical History:  ?Diagnosis Date  ? Thyroid disease   ? ? ?Objective: ?No conversational dyspnea ?Age appropriate judgment and insight ?Nml affect and mood ? ?Assessment and Plan: ?Screening-pulmonary TB - Plan: QuantiFERON-TB Gold Plus ? ?We will have her come in for a lab draw and released on MyChart.  Follow-up as originally scheduled or as needed. ?The patient voiced understanding and agreement to the plan. ? ?Shelda Pal, DO ?05/02/21  ?3:29 PM ? ? ? ? ?

## 2021-05-02 NOTE — Telephone Encounter (Signed)
Pt will call back schedule

## 2021-05-03 ENCOUNTER — Other Ambulatory Visit (INDEPENDENT_AMBULATORY_CARE_PROVIDER_SITE_OTHER): Payer: 59

## 2021-05-03 DIAGNOSIS — Z111 Encounter for screening for respiratory tuberculosis: Secondary | ICD-10-CM

## 2021-05-03 NOTE — Addendum Note (Signed)
Addended by: Mervin Kung A on: 05/03/2021 11:13 AM ? ? Modules accepted: Orders ? ?

## 2021-05-07 LAB — QUANTIFERON-TB GOLD PLUS
Mitogen-NIL: >10 IU/mL
NIL: 0.03 IU/mL
QuantiFERON-TB Gold Plus: NEGATIVE
TB1-NIL: 0 IU/mL
TB2-NIL: <0 IU/mL

## 2021-08-12 ENCOUNTER — Encounter: Payer: 59 | Admitting: Family Medicine

## 2021-08-22 ENCOUNTER — Encounter: Payer: Self-pay | Admitting: Family Medicine

## 2021-10-04 ENCOUNTER — Ambulatory Visit (INDEPENDENT_AMBULATORY_CARE_PROVIDER_SITE_OTHER): Payer: 59 | Admitting: Family Medicine

## 2021-10-04 ENCOUNTER — Encounter: Payer: Self-pay | Admitting: Family Medicine

## 2021-10-04 ENCOUNTER — Other Ambulatory Visit: Payer: Self-pay | Admitting: Family Medicine

## 2021-10-04 VITALS — BP 102/62 | HR 74 | Temp 98.3°F | Ht 62.0 in | Wt 117.5 lb

## 2021-10-04 DIAGNOSIS — E063 Autoimmune thyroiditis: Secondary | ICD-10-CM

## 2021-10-04 DIAGNOSIS — Z0000 Encounter for general adult medical examination without abnormal findings: Secondary | ICD-10-CM | POA: Diagnosis not present

## 2021-10-04 LAB — CBC
HCT: 41.1 % (ref 36.0–46.0)
Hemoglobin: 13.4 g/dL (ref 12.0–15.0)
MCHC: 32.6 g/dL (ref 30.0–36.0)
MCV: 93.3 fl (ref 78.0–100.0)
Platelets: 206 10*3/uL (ref 150.0–400.0)
RBC: 4.4 Mil/uL (ref 3.87–5.11)
RDW: 13.6 % (ref 11.5–15.5)
WBC: 3.9 10*3/uL — ABNORMAL LOW (ref 4.0–10.5)

## 2021-10-04 LAB — COMPREHENSIVE METABOLIC PANEL
ALT: 10 U/L (ref 0–35)
AST: 16 U/L (ref 0–37)
Albumin: 4.2 g/dL (ref 3.5–5.2)
Alkaline Phosphatase: 55 U/L (ref 39–117)
BUN: 5 mg/dL — ABNORMAL LOW (ref 6–23)
CO2: 29 mEq/L (ref 19–32)
Calcium: 9.4 mg/dL (ref 8.4–10.5)
Chloride: 104 mEq/L (ref 96–112)
Creatinine, Ser: 0.62 mg/dL (ref 0.40–1.20)
GFR: 124.35 mL/min (ref >60.00)
Glucose, Bld: 86 mg/dL (ref 70–99)
Potassium: 4 mEq/L (ref 3.5–5.1)
Sodium: 140 mEq/L (ref 135–145)
Total Bilirubin: 0.7 mg/dL (ref 0.2–1.2)
Total Protein: 7.1 g/dL (ref 6.0–8.3)

## 2021-10-04 LAB — LIPID PANEL
Cholesterol: 177 mg/dL (ref 0–200)
HDL: 54.2 mg/dL (ref >39.00)
LDL Cholesterol: 107 mg/dL — ABNORMAL HIGH (ref 0–99)
NonHDL: 122.41
Total CHOL/HDL Ratio: 3
Triglycerides: 79 mg/dL (ref 0.0–149.0)
VLDL: 15.8 mg/dL (ref 0.0–40.0)

## 2021-10-04 LAB — T4, FREE: Free T4: 0.92 ng/dL (ref 0.60–1.60)

## 2021-10-04 LAB — TSH: TSH: 5.59 u[IU]/mL — ABNORMAL HIGH (ref 0.35–5.50)

## 2021-10-04 MED ORDER — LEVOTHYROXINE SODIUM 75 MCG PO TABS: 75.0000 ug | ORAL_TABLET | Freq: Every day | ORAL | 2 refills | Status: DC

## 2021-10-04 NOTE — Patient Instructions (Addendum)
Give [place] 2-3 business days to get the results of your labs back.   Keep the diet clean and stay active.  Please get me a copy of your advanced directive form at your convenience.   I recommend getting the flu shot in mid October. This suggestion would change if the CDC comes out with a different recommendation.   Call Center for [facility] at [facility] at [phone] for an appointment.  They are located at [address], [address], [place], [place], [address] (right across the hall from our office).  Let us know if you need anything.

## 2021-10-04 NOTE — Progress Notes (Signed)
Chief Complaint  Patient presents with   Annual Exam     Monique Woman Monique Sharp is here for a complete physical.   Her last physical was >1 year ago.  Current diet: in general, a "healthy" diet. Current exercise: walking. Fatigue out of ordinary? No Seatbelt? Yes Advanced directive? No  Health Maintenance Pap/HPV- Due Tetanus- Yes HIV screening- Yes Hep C screening- Yes  Past Medical History:  Diagnosis Date   Thyroid disease      Past Surgical History:  Procedure Laterality Date   NO PAST SURGERIES      Medications  Current Outpatient Medications on File Prior to Visit  Medication Sig Dispense Refill   levothyroxine (SYNTHROID) 75 MCG tablet Take 1 tablet (75 mcg total) by mouth daily. 90 tablet 2    Allergies No Known Allergies  Review of Systems: Constitutional:  no unexpected weight changes Eye:  no recent significant change in vision Ear/Nose/Mouth/Throat:  Ears:  no tinnitus or vertigo and no recent change in hearing Nose/Mouth/Throat:  no complaints of nasal congestion, no sore throat Cardiovascular: no chest pain Respiratory:  no cough and no shortness of breath Gastrointestinal:  no abdominal pain, no change in bowel habits GU:  Sharp: negative for dysuria or pelvic pain Musculoskeletal/Extremities:  no pain of the joints Integumentary (Skin/Breast):  no abnormal skin lesions reported Neurologic:  no headaches Endocrine:  denies fatigue Hematologic/Lymphatic:  No areas of easy bleeding  Exam BP 102/62   Pulse 74   Temp 98.3 F (36.8 C) (Oral)   Ht 5\' 2"  (1.575 m)   Wt 117 lb 8 oz (53.3 kg)   SpO2 98%   BMI 21.49 kg/m  General:  Monique developed, Monique nourished, in no apparent distress Skin:  no significant moles, warts, or growths Head:  no masses, lesions, or tenderness Eyes:  pupils equal and round, sclera anicteric without injection Ears:  canals without lesions, TMs shiny without retraction, no obvious effusion, no erythema Nose:   nares patent, septum midline, mucosa normal, and no drainage or sinus tenderness Throat/Pharynx:  lips and gingiva without lesion; tongue and uvula midline; non-inflamed pharynx; no exudates or postnasal drainage Neck: neck supple without adenopathy, thyromegaly, or masses Lungs:  clear to auscultation, breath sounds equal bilaterally, no respiratory distress Cardio:  regular rate and rhythm, no bruits, no LE edema Abdomen:  abdomen soft, nontender; bowel sounds normal; no masses or organomegaly Genital: Defer to GYN Musculoskeletal:  symmetrical muscle groups noted without atrophy or deformity Extremities:  no clubbing, cyanosis, or edema, no deformities, no skin discoloration Neuro:  gait normal; deep tendon reflexes normal and symmetric Psych: Monique oriented with normal range of affect and appropriate judgment/insight  Assessment and Plan  Monique adult exam - Plan: CBC, Comprehensive metabolic panel, Lipid panel  Hypothyroidism, acquired, autoimmune - Plan: T4, free, TSH   Monique Sharp. Counseled on diet and exercise. Other orders as above. Advanced directive form provided today.  GYN info provided as she is due for this.  Follow up in 6 mo or prn. The patient voiced understanding and agreement to the plan.  25 [place], DO 10/04/21 8:16 AM

## 2021-10-24 ENCOUNTER — Encounter: Payer: Self-pay | Admitting: Family Medicine

## 2021-10-28 ENCOUNTER — Telehealth (INDEPENDENT_AMBULATORY_CARE_PROVIDER_SITE_OTHER): Payer: Commercial Managed Care - HMO | Admitting: Family Medicine

## 2021-10-28 ENCOUNTER — Encounter: Payer: Self-pay | Admitting: Family Medicine

## 2021-10-28 DIAGNOSIS — F418 Other specified anxiety disorders: Secondary | ICD-10-CM | POA: Diagnosis not present

## 2021-10-28 NOTE — Progress Notes (Signed)
Chief Complaint  Patient presents with   Follow-up    School note accomodation    Subjective Monique Sharp presents for f/u anxiety. Due to COVID-19 pandemic, we are interacting via web portal for an electronic face-to-face visit. I verified patient's ID using 2 identifiers. Patient agreed to proceed with visit via this method. Patient is at home, I am at office. Patient and I are present for visit.   Pt is currently not on any tx.  Anxiety is improved since her last visit but she is still having high levels of stress during exams. She is requesting a letter allowing her to intermittently stretch and stand and have water when she takes exams. No thoughts of harming self or others. No self-medication with alcohol, prescription drugs or illicit drugs. Pt is not following with a counselor/psychologist.  Past Medical History:  Diagnosis Date   Thyroid disease    Allergies as of 10/28/2021   No Known Allergies      Medication List        Accurate as of October 28, 2021 10:36 AM. If you have any questions, ask your nurse or doctor.          levothyroxine 75 MCG tablet Commonly known as: SYNTHROID Take 1 tablet (75 mcg total) by mouth daily.        Exam No conversational dyspnea Age appropriate judgment and insight Nml affect and mood  Assessment and Plan  Situational anxiety  We will write a letter asking college to allow her to have water in her seat and get up and stretch during exams.  I did offer medication which she politely declined at this time given the situational nature of this.  She is not currently following with a counselor, nor do I think it is necessary to do this at this point.  Counseled on exercise. F/u prn. The patient voiced understanding and agreement to the plan.  Monique Roche [place], DO 10/28/21 10:36 AM

## 2022-03-08 ENCOUNTER — Telehealth: Payer: Self-pay | Admitting: Family Medicine

## 2022-03-08 DIAGNOSIS — Z111 Encounter for screening for respiratory tuberculosis: Secondary | ICD-10-CM

## 2022-03-08 NOTE — Telephone Encounter (Signed)
Patient called to see if she could have the TB blood test done. Please see if orders can be placed and advise for scheduling.

## 2022-03-08 NOTE — Telephone Encounter (Signed)
Ok to schedule lab appointment Lab order entered.

## 2022-03-09 ENCOUNTER — Other Ambulatory Visit (INDEPENDENT_AMBULATORY_CARE_PROVIDER_SITE_OTHER): Payer: Medicaid Other

## 2022-03-09 DIAGNOSIS — Z111 Encounter for screening for respiratory tuberculosis: Secondary | ICD-10-CM

## 2022-03-11 LAB — QUANTIFERON-TB GOLD PLUS
Mitogen-NIL: >10 IU/mL
NIL: 0.04 IU/mL
QuantiFERON-TB Gold Plus: NEGATIVE
TB1-NIL: 0.01 IU/mL
TB2-NIL: 0 IU/mL

## 2022-03-19 ENCOUNTER — Other Ambulatory Visit: Payer: Self-pay | Admitting: Family Medicine

## 2022-03-19 DIAGNOSIS — E063 Autoimmune thyroiditis: Secondary | ICD-10-CM

## 2022-04-10 ENCOUNTER — Other Ambulatory Visit: Payer: Self-pay | Admitting: Family Medicine

## 2022-04-10 ENCOUNTER — Ambulatory Visit (INDEPENDENT_AMBULATORY_CARE_PROVIDER_SITE_OTHER): Payer: Medicaid Other | Admitting: Family Medicine

## 2022-04-10 ENCOUNTER — Encounter: Payer: Self-pay | Admitting: Family Medicine

## 2022-04-10 VITALS — BP 102/62 | HR 81 | Temp 98.2°F | Ht 62.0 in | Wt 125.1 lb

## 2022-04-10 DIAGNOSIS — E063 Autoimmune thyroiditis: Secondary | ICD-10-CM | POA: Diagnosis not present

## 2022-04-10 LAB — TSH: TSH: 4.09 u[IU]/mL (ref 0.35–5.50)

## 2022-04-10 LAB — T4, FREE: Free T4: 1.06 ng/dL (ref 0.60–1.60)

## 2022-04-10 MED ORDER — LEVOTHYROXINE SODIUM 75 MCG PO TABS: 75.0000 ug | ORAL_TABLET | Freq: Every day | ORAL | 2 refills | Status: DC

## 2022-04-10 NOTE — Progress Notes (Signed)
Chief Complaint  Patient presents with   Follow-up    6 month   Rash    facial    Subjective: Patient is a 27 y.o. female here for f/u.  Hypothyroidism Patient presents for follow-up of hypothyroidism.  Reports compliance with medication- levothyroxine 75 mcg/d. Current symptoms include: hair thinning Denies fatigue, weight changes, heat/cold intolerance, bowel/skin changes or CVS symptoms She believes her dose should be not significantly changed  Facial rash since July 2023. No new soaps, lotions, topicals, detergents. Itching initially, not much lately. Scaly and dry. No redness, drainage, pain. Has been using a fragrance-free moisturizer without relief. No other therapies tried.   Past Medical History:  Diagnosis Date   Thyroid disease     Objective: BP 102/62 (BP Location: Left Arm, Patient Position: Sitting, Cuff Size: Normal)   Pulse 81   Temp 98.2 F (36.8 C) (Oral)   Ht 5' 2"$  (1.575 m)   Wt 125 lb 2 oz (56.8 kg)   SpO2 99%   BMI 22.89 kg/m  General: Awake, appears stated age Heart: RRR, no LE edema Skin: Hypopigmented patches over max sinuses b/l with scaling; no alopecia Neck: No thyromegaly or gross deformity Lungs: CTAB, no rales, wheezes or rhonchi. No accessory muscle use Psych: Age appropriate judgment and insight, normal affect and mood  Assessment and Plan: Hypothyroidism, acquired, autoimmune - Plan: TSH, T4, free  Chronic, stable. Cont levothyroxine 75 mcg/d. Ck labs.  For hair thinning, if neg labs again, will have her increase protein intake to 90-100 g/d.  For rash, will cont emollient bid usage. Add topical steroid 0.5-1% bid for 10 d. If no better, will refer to derm.  GYN info provided.  The patient voiced understanding and agreement to the plan.  [place], DO 04/10/22  8:11 AM

## 2022-04-10 NOTE — Patient Instructions (Addendum)
Give [place] 2-3 business days to get the results of your labs back.   Try to get around 90-100 g of protein in per day.   Please schedule your pelvic exam.   Call Center for [facility] at [facility] at [phone] for an appointment.  They are located at [address], [address], [place], [place], [address] (right across the hall from our office).  Try 0.5-1% cortisone cream on your face twice daily for 10 days. If no better, send me a message and we will refer you to a dermatologist.   Let us know if you need anything.

## 2022-04-21 ENCOUNTER — Encounter: Payer: Self-pay | Admitting: General Practice

## 2022-06-02 DIAGNOSIS — H5213 Myopia, bilateral: Secondary | ICD-10-CM | POA: Diagnosis not present

## 2022-06-08 ENCOUNTER — Telehealth: Payer: Self-pay

## 2022-06-08 NOTE — Telephone Encounter (Signed)
Left voicemail for patient to call back and reschedule her New Physical with Dr. Briscoe Deutscher.

## 2022-06-19 ENCOUNTER — Encounter: Payer: Medicaid Other | Admitting: Obstetrics and Gynecology

## 2022-06-21 ENCOUNTER — Telehealth: Payer: Self-pay

## 2022-06-21 NOTE — Telephone Encounter (Signed)
Left voicemail for patient to call back and reschedule her New Physical with Dr. Briscoe Deutscher in June. Informed her that she only has a few appointment slots left and needs to call us back to schedule.

## 2022-07-03 DIAGNOSIS — Z0000 Encounter for general adult medical examination without abnormal findings: Secondary | ICD-10-CM | POA: Diagnosis not present

## 2022-07-03 DIAGNOSIS — Z7689 Persons encountering health services in other specified circumstances: Secondary | ICD-10-CM | POA: Diagnosis not present

## 2022-09-27 ENCOUNTER — Encounter (INDEPENDENT_AMBULATORY_CARE_PROVIDER_SITE_OTHER): Payer: Self-pay

## 2022-10-16 ENCOUNTER — Encounter: Payer: Medicaid Other | Admitting: Family Medicine

## 2022-10-19 ENCOUNTER — Ambulatory Visit (INDEPENDENT_AMBULATORY_CARE_PROVIDER_SITE_OTHER): Payer: Medicaid Other | Admitting: Family Medicine

## 2022-10-19 ENCOUNTER — Encounter: Payer: Self-pay | Admitting: Family Medicine

## 2022-10-19 VITALS — BP 108/62 | HR 82 | Temp 98.8°F | Ht 62.0 in | Wt 129.5 lb

## 2022-10-19 DIAGNOSIS — E063 Autoimmune thyroiditis: Secondary | ICD-10-CM | POA: Diagnosis not present

## 2022-10-19 DIAGNOSIS — Z0000 Encounter for general adult medical examination without abnormal findings: Secondary | ICD-10-CM

## 2022-10-19 LAB — LIPID PANEL
Cholesterol: 225 mg/dL — ABNORMAL HIGH (ref 0–200)
HDL: 62.8 mg/dL (ref >39.00)
LDL Cholesterol: 140 mg/dL — ABNORMAL HIGH (ref 0–99)
NonHDL: 162.11
Total CHOL/HDL Ratio: 4
Triglycerides: 111 mg/dL (ref 0.0–149.0)
VLDL: 22.2 mg/dL (ref 0.0–40.0)

## 2022-10-19 LAB — CBC
HCT: 44 % (ref 36.0–46.0)
Hemoglobin: 14.5 g/dL (ref 12.0–15.0)
MCHC: 32.9 g/dL (ref 30.0–36.0)
MCV: 93.3 fl (ref 78.0–100.0)
Platelets: 239 10*3/uL (ref 150.0–400.0)
RBC: 4.71 Mil/uL (ref 3.87–5.11)
RDW: 13.5 % (ref 11.5–15.5)
WBC: 6 10*3/uL (ref 4.0–10.5)

## 2022-10-19 LAB — COMPREHENSIVE METABOLIC PANEL
ALT: 12 U/L (ref 0–35)
AST: 17 U/L (ref 0–37)
Albumin: 4.5 g/dL (ref 3.5–5.2)
Alkaline Phosphatase: 52 U/L (ref 39–117)
BUN: 7 mg/dL (ref 6–23)
CO2: 29 meq/L (ref 19–32)
Calcium: 9.6 mg/dL (ref 8.4–10.5)
Chloride: 103 meq/L (ref 96–112)
Creatinine, Ser: 0.71 mg/dL (ref 0.40–1.20)
GFR: 117.86 mL/min (ref >60.00)
Glucose, Bld: 69 mg/dL — ABNORMAL LOW (ref 70–99)
Potassium: 4.3 meq/L (ref 3.5–5.1)
Sodium: 139 meq/L (ref 135–145)
Total Bilirubin: 1 mg/dL (ref 0.2–1.2)
Total Protein: 7.4 g/dL (ref 6.0–8.3)

## 2022-10-19 MED ORDER — LEVOTHYROXINE SODIUM 75 MCG PO TABS: 75.0000 ug | ORAL_TABLET | Freq: Every day | ORAL | 2 refills | Status: DC

## 2022-10-19 NOTE — Patient Instructions (Signed)
Give us 2-3 business days to get the results of your labs back.   Keep the diet clean and stay active.  Please get me a copy of your advanced directive form at your convenience.   I recommend getting the flu shot in mid October. This suggestion would change if the CDC comes out with a different recommendation.   Let us know if you need anything.  

## 2022-10-19 NOTE — Progress Notes (Signed)
Chief Complaint  Patient presents with   Annual Exam     Well Woman Monique Sharp is here for a complete physical.   Her last physical was >1 year ago.  Current diet: in general, a "healthy" diet. Current exercise: speed walking, wt lifting. Fatigue out of ordinary? No Seatbelt? Yes Advanced directive? No  Health Maintenance Pap/HPV- Yes Tetanus- Yes HIV screening- Yes Hep C screening- Yes  Past Medical History:  Diagnosis Date   Thyroid disease      Past Surgical History:  Procedure Laterality Date   NO PAST SURGERIES      Medications  Levothyroxine 75 mcg/d.     Allergies No Known Allergies  Review of Systems: Constitutional:  no unexpected weight changes Eye:  no recent significant change in vision Ear/Nose/Mouth/Throat:  Ears:  no tinnitus or vertigo and no recent change in hearing Nose/Mouth/Throat:  no complaints of nasal congestion, no sore throat Cardiovascular: no chest pain Respiratory:  no cough and no shortness of breath Gastrointestinal:  no abdominal pain, no change in bowel habits GU:  Female: negative for dysuria or pelvic pain Musculoskeletal/Extremities:  no pain of the joints Integumentary (Skin/Breast):  no abnormal skin lesions reported Neurologic:  no headaches Endocrine:  denies fatigue Hematologic/Lymphatic:  No areas of easy bleeding  Exam BP 108/62 (BP Location: Left Arm, Patient Position: Sitting, Cuff Size: Normal)   Pulse 82   Temp 98.8 F (37.1 C) (Oral)   Ht 5\' 2"  (1.575 m)   Wt 129 lb 8 oz (58.7 kg)   SpO2 99%   BMI 23.69 kg/m  General:  well developed, well nourished, in no apparent distress Skin:  no significant moles, warts, or growths Head:  no masses, lesions, or tenderness Eyes:  pupils equal and round, sclera anicteric without injection Ears:  canals without lesions, TMs shiny without retraction, no obvious effusion, no erythema Nose:  nares patent, mucosa normal, and no drainage  Throat/Pharynx:  lips and  gingiva without lesion; tongue and uvula midline; non-inflamed pharynx; no exudates or postnasal drainage Neck: neck supple without adenopathy, thyromegaly, or masses Lungs:  clear to auscultation, breath sounds equal bilaterally, no respiratory distress Cardio:  regular rate and rhythm, no bruits, no LE edema Abdomen:  abdomen soft, nontender; bowel sounds normal; no masses or organomegaly Genital: Defer to GYN Musculoskeletal:  symmetrical muscle groups noted without atrophy or deformity Extremities:  no clubbing, cyanosis, or edema, no deformities, no skin discoloration Neuro:  gait normal; deep tendon reflexes normal and symmetric Psych: well oriented with normal range of affect and appropriate judgment/insight  Assessment and Plan  Well adult exam - Plan: CBC, Comprehensive metabolic panel, Lipid panel  Hypothyroidism, acquired, autoimmune - Plan: levothyroxine (SYNTHROID) 75 MCG tablet, T4, free, TSH   Well 27 y.o. female. Counseled on diet and exercise. Advanced directive form provided today.  Other orders as above. Follow up in 6 mo. The patient voiced understanding and agreement to the plan.  Jilda Roche [place], DO 10/19/22 8:32 AM

## 2022-10-20 LAB — T4, FREE: Free T4: 0.68 ng/dL (ref 0.60–1.60)

## 2022-10-20 LAB — TSH: TSH: 38.67 u[IU]/mL — ABNORMAL HIGH (ref 0.35–5.50)

## 2023-03-27 ENCOUNTER — Encounter: Payer: Self-pay | Admitting: Family Medicine

## 2023-03-30 ENCOUNTER — Encounter: Payer: Self-pay | Admitting: Family Medicine

## 2023-03-30 ENCOUNTER — Ambulatory Visit (INDEPENDENT_AMBULATORY_CARE_PROVIDER_SITE_OTHER): Payer: Medicaid Other | Admitting: Family Medicine

## 2023-03-30 VITALS — BP 110/64 | HR 72 | Temp 98.0°F | Resp 16 | Ht 62.0 in | Wt 131.4 lb

## 2023-03-30 DIAGNOSIS — E7800 Pure hypercholesterolemia, unspecified: Secondary | ICD-10-CM

## 2023-03-30 DIAGNOSIS — E063 Autoimmune thyroiditis: Secondary | ICD-10-CM | POA: Diagnosis not present

## 2023-03-30 MED ORDER — LEVOTHYROXINE SODIUM 75 MCG PO TABS: 75.0000 ug | ORAL_TABLET | Freq: Every day | ORAL | 2 refills | Status: DC

## 2023-03-30 NOTE — Progress Notes (Signed)
Chief Complaint  Patient presents with   Medication Refill    Medication check    Subjective: Patient is a 28 y.o. female here for f/u.  Hypothyroidism Patient presents for follow-up of hypothyroidism.  Reports compliance with medication-levothyroxine 75 mcg daily. Current symptoms include: denies fatigue, weight changes, heat/cold intolerance, bowel/skin changes or CVS symptoms She believes her dose should be not significantly changed  Past Medical History:  Diagnosis Date   Thyroid disease     Objective: BP 110/64   Pulse 72   Temp 98 F (36.7 C) (Oral)   Resp 16   Ht 5\' 2"  (1.575 m)   Wt 131 lb 6.4 oz (59.6 kg)   SpO2 100%   BMI 24.03 kg/m  General: Awake, appears stated age Heart: RRR, no LE edema Lungs: CTAB, no rales, wheezes or rhonchi. No accessory muscle use Psych: Age appropriate judgment and insight, normal affect and mood  Assessment and Plan: Hypothyroidism, acquired, autoimmune - Plan: levothyroxine (SYNTHROID) 75 MCG tablet, TSH, T4, free  Pure hypercholesterolemia - Plan: Lipid panel, Comprehensive metabolic panel, Hemoglobin A1c  Chronic, stable. Cont levothyroxine 75 mcg daily.  Check labs.  She is moving to [place].  If she needs refills sent to a local pharmacy, she will reach out to [place]. The patient voiced understanding and agreement to the plan.  Jilda Roche [place], DO 03/30/23  2:18 PM

## 2023-03-30 NOTE — Patient Instructions (Signed)
 Give [place] 2-3 business days to get the results of your labs back.  Let us know if you need anything.

## 2023-03-30 NOTE — Addendum Note (Signed)
Addended by: Mervin Kung A on: 03/30/2023 02:20 PM   Modules accepted: Orders

## 2023-03-31 ENCOUNTER — Encounter: Payer: Self-pay | Admitting: Family Medicine

## 2023-03-31 LAB — TSH: TSH: 19.05 m[IU]/L — ABNORMAL HIGH

## 2023-03-31 LAB — COMPREHENSIVE METABOLIC PANEL
AG Ratio: 1.8 (calc) (ref 1.0–2.5)
ALT: 12 U/L (ref 6–29)
AST: 16 U/L (ref 10–30)
Albumin: 4.5 g/dL (ref 3.6–5.1)
Alkaline phosphatase (APISO): 53 U/L (ref 31–125)
BUN: 13 mg/dL (ref 7–25)
CO2: 25 mmol/L (ref 20–32)
Calcium: 9.4 mg/dL (ref 8.6–10.2)
Chloride: 104 mmol/L (ref 98–110)
Creat: 0.67 mg/dL (ref 0.50–0.96)
Globulin: 2.5 g/dL (ref 1.9–3.7)
Glucose, Bld: 84 mg/dL (ref 65–99)
Potassium: 4.2 mmol/L (ref 3.5–5.3)
Sodium: 139 mmol/L (ref 135–146)
Total Bilirubin: 0.5 mg/dL (ref 0.2–1.2)
Total Protein: 7 g/dL (ref 6.1–8.1)

## 2023-03-31 LAB — LIPID PANEL
Cholesterol: 211 mg/dL — ABNORMAL HIGH (ref <200)
HDL: 66 mg/dL (ref >=50)
LDL Cholesterol (Calc): 127 mg/dL — ABNORMAL HIGH
Non-HDL Cholesterol (Calc): 145 mg/dL — ABNORMAL HIGH (ref <130)
Total CHOL/HDL Ratio: 3.2 (calc) (ref <5.0)
Triglycerides: 83 mg/dL (ref <150)

## 2023-03-31 LAB — HEMOGLOBIN A1C
Hgb A1c MFr Bld: 5.3 %{Hb} (ref <5.7)
Mean Plasma Glucose: 105 mg/dL
eAG (mmol/L): 5.8 mmol/L

## 2023-03-31 LAB — T4, FREE: Free T4: 1.1 ng/dL (ref 0.8–1.8)

## 2023-04-02 ENCOUNTER — Other Ambulatory Visit: Payer: Self-pay | Admitting: Family

## 2023-04-02 MED ORDER — LEVOTHYROXINE SODIUM 88 MCG PO TABS
88.0000 ug | ORAL_TABLET | Freq: Every day | ORAL | 3 refills | Status: DC
Start: 2023-04-02 — End: 2023-06-29

## 2023-04-23 ENCOUNTER — Ambulatory Visit: Payer: Medicaid Other | Admitting: Family Medicine

## 2023-04-24 ENCOUNTER — Telehealth: Payer: Self-pay | Admitting: Family Medicine

## 2023-04-24 ENCOUNTER — Telehealth: Payer: Self-pay

## 2023-04-24 NOTE — Telephone Encounter (Signed)
 Copied from CRM [id number]. Topic: Medical Record Request - Records Request >> Apr 24, 2023  9:51 AM [place] J wrote: Reason for CRM: Patient would like to have a print out of full vaccination history.

## 2023-04-24 NOTE — Telephone Encounter (Signed)
 Please Advised  Pt stated she is going to school, and was advised that she needed MMR Copied from CRM [id number]. Topic: Appointments - Appointment Scheduling >> Apr 24, 2023  9:52 AM [place] J wrote: Patient would like to know if MMR vaccinations are available. I called clinic but the lady I spoke with was unsure and [facility] did not specify.

## 2023-04-24 NOTE — Telephone Encounter (Signed)
 Called spoke with pt and sent message to provider about MMR. Pt was advised would be in touch once Dr.Wendling view message and could print off shot records then.

## 2023-04-26 ENCOUNTER — Telehealth: Payer: Self-pay | Admitting: Family Medicine

## 2023-04-26 NOTE — Telephone Encounter (Signed)
 Copied from CRM [id number]. Topic: Medical Record Request - Records Request >> Apr 24, 2023  9:51 AM [place] J wrote: Reason for CRM: Patient would like to have a print out of full vaccination history. >> Apr 26, 2023 11:34 AM [place] Maxin H wrote: Patient is calling following up on copy of vaccine records stating she's leaving town tomorrow and needs them.

## 2023-04-27 NOTE — Telephone Encounter (Signed)
 Called patient and let her know that copy of immunizations is upfront for her to pick up.

## 2023-05-28 ENCOUNTER — Encounter: Payer: Self-pay | Admitting: Family Medicine

## 2023-05-30 ENCOUNTER — Other Ambulatory Visit: Payer: Self-pay

## 2023-05-30 DIAGNOSIS — E063 Autoimmune thyroiditis: Secondary | ICD-10-CM

## 2023-06-20 DIAGNOSIS — E063 Autoimmune thyroiditis: Secondary | ICD-10-CM | POA: Diagnosis not present

## 2023-06-21 LAB — TSH: TSH: 8.08 u[IU]/mL — ABNORMAL HIGH (ref 0.450–4.500)

## 2023-06-26 ENCOUNTER — Other Ambulatory Visit: Payer: Self-pay

## 2023-06-26 DIAGNOSIS — E063 Autoimmune thyroiditis: Secondary | ICD-10-CM

## 2023-06-29 ENCOUNTER — Other Ambulatory Visit: Payer: Self-pay

## 2023-06-29 MED ORDER — LEVOTHYROXINE SODIUM 88 MCG PO TABS: 88.0000 ug | ORAL_TABLET | Freq: Every day | ORAL | 3 refills | Status: DC

## 2023-07-02 ENCOUNTER — Encounter: Payer: Self-pay | Admitting: Family Medicine

## 2023-07-02 NOTE — Addendum Note (Signed)
 Addended by: Marylou Sobers D on: 07/02/2023 02:35 PM   Modules accepted: Orders

## 2023-07-04 ENCOUNTER — Other Ambulatory Visit: Payer: Self-pay

## 2023-07-04 MED ORDER — LEVOTHYROXINE SODIUM 88 MCG PO TABS: 88.0000 ug | ORAL_TABLET | Freq: Every day | ORAL | 3 refills | Status: AC

## 2023-07-09 ENCOUNTER — Telehealth: Payer: Self-pay | Admitting: Family Medicine

## 2023-07-09 ENCOUNTER — Other Ambulatory Visit (INDEPENDENT_AMBULATORY_CARE_PROVIDER_SITE_OTHER)

## 2023-07-09 ENCOUNTER — Encounter: Payer: Self-pay | Admitting: Family Medicine

## 2023-07-09 ENCOUNTER — Other Ambulatory Visit: Payer: Self-pay

## 2023-07-09 DIAGNOSIS — E063 Autoimmune thyroiditis: Secondary | ICD-10-CM | POA: Diagnosis not present

## 2023-07-09 LAB — T4, FREE: Free T4: 0.9 ng/dL (ref 0.60–1.60)

## 2023-07-09 LAB — TSH: TSH: 6 u[IU]/mL — ABNORMAL HIGH (ref 0.35–5.50)

## 2023-07-09 NOTE — Telephone Encounter (Signed)
 Orders are placed

## 2023-07-09 NOTE — Telephone Encounter (Signed)
 I have this pt on the lab schedule just need some orders

## 2023-09-28 ENCOUNTER — Ambulatory Visit: Payer: Medicaid Other | Admitting: Family Medicine
